# Patient Record
Sex: Male | Born: 1980 | Race: White | Hispanic: No | Marital: Single | Smoking: Current every day smoker
Health system: Southern US, Community
[De-identification: ages and names within clinical notes are randomized; demographics above are authoritative.]

## PROBLEM LIST (undated history)

## (undated) DIAGNOSIS — K219 Gastro-esophageal reflux disease without esophagitis: Secondary | ICD-10-CM

---

## 2002-11-20 ENCOUNTER — Emergency Department (HOSPITAL_COMMUNITY): Admission: EM | Admit: 2002-11-20 | Discharge: 2002-11-20 | Payer: Self-pay | Admitting: Emergency Medicine

## 2010-01-18 ENCOUNTER — Emergency Department (HOSPITAL_BASED_OUTPATIENT_CLINIC_OR_DEPARTMENT_OTHER): Admission: EM | Admit: 2010-01-18 | Discharge: 2010-01-18 | Payer: Self-pay | Admitting: Emergency Medicine

## 2010-01-18 ENCOUNTER — Ambulatory Visit: Payer: Self-pay | Admitting: Radiology

## 2010-07-19 ENCOUNTER — Emergency Department (HOSPITAL_BASED_OUTPATIENT_CLINIC_OR_DEPARTMENT_OTHER)
Admission: EM | Admit: 2010-07-19 | Discharge: 2010-07-19 | Payer: Self-pay | Source: Home / Self Care | Admitting: Emergency Medicine

## 2010-09-09 LAB — BASIC METABOLIC PANEL
BUN: 18 mg/dL (ref 6–23)
CO2: 27 mEq/L (ref 19–32)
Creatinine, Ser: 1 mg/dL (ref 0.4–1.5)
GFR calc non Af Amer: >60 mL/min (ref >60)
Potassium: 4 mEq/L (ref 3.5–5.1)
Sodium: 143 mEq/L (ref 135–145)

## 2010-09-09 LAB — BASIC METABOLIC PANEL WITH GFR
Calcium: 8.7 mg/dL (ref 8.4–10.5)
Chloride: 106 meq/L (ref 96–112)
GFR calc Af Amer: >60 mL/min (ref >60)
Glucose, Bld: 109 mg/dL — ABNORMAL HIGH (ref 70–99)

## 2010-09-09 LAB — POCT CARDIAC MARKERS
CKMB, poc: <1 ng/mL — ABNORMAL LOW (ref 1.0–8.0)
Myoglobin, poc: 43.2 ng/mL (ref 12–200)
Myoglobin, poc: 50.7 ng/mL (ref 12–200)

## 2010-09-09 LAB — D-DIMER, QUANTITATIVE: D-Dimer, Quant: <0.22 ug/mL-FEU (ref 0.00–0.48)

## 2010-10-07 ENCOUNTER — Emergency Department (HOSPITAL_BASED_OUTPATIENT_CLINIC_OR_DEPARTMENT_OTHER)
Admission: EM | Admit: 2010-10-07 | Discharge: 2010-10-07 | Disposition: A | Discharge status: Home / Self Care | Payer: Self-pay | Attending: Emergency Medicine | Admitting: Emergency Medicine

## 2010-10-07 ENCOUNTER — Emergency Department (INDEPENDENT_AMBULATORY_CARE_PROVIDER_SITE_OTHER): Payer: Self-pay

## 2010-10-07 DIAGNOSIS — S060X0A Concussion without loss of consciousness, initial encounter: Secondary | ICD-10-CM | POA: Insufficient documentation

## 2010-10-07 DIAGNOSIS — S1093XA Contusion of unspecified part of neck, initial encounter: Secondary | ICD-10-CM

## 2010-10-07 DIAGNOSIS — IMO0002 Reserved for concepts with insufficient information to code with codable children: Secondary | ICD-10-CM

## 2010-10-07 DIAGNOSIS — F17200 Nicotine dependence, unspecified, uncomplicated: Secondary | ICD-10-CM | POA: Insufficient documentation

## 2010-10-07 DIAGNOSIS — Y92009 Unspecified place in unspecified non-institutional (private) residence as the place of occurrence of the external cause: Secondary | ICD-10-CM | POA: Insufficient documentation

## 2011-12-18 ENCOUNTER — Emergency Department (HOSPITAL_BASED_OUTPATIENT_CLINIC_OR_DEPARTMENT_OTHER): Payer: Self-pay

## 2011-12-18 ENCOUNTER — Emergency Department (HOSPITAL_BASED_OUTPATIENT_CLINIC_OR_DEPARTMENT_OTHER)
Admission: EM | Admit: 2011-12-18 | Discharge: 2011-12-18 | Disposition: A | Discharge status: Home / Self Care | Payer: Self-pay | Attending: Emergency Medicine | Admitting: Emergency Medicine

## 2011-12-18 ENCOUNTER — Encounter (HOSPITAL_BASED_OUTPATIENT_CLINIC_OR_DEPARTMENT_OTHER): Payer: Self-pay | Admitting: *Deleted

## 2011-12-18 DIAGNOSIS — L0390 Cellulitis, unspecified: Secondary | ICD-10-CM

## 2011-12-18 DIAGNOSIS — K219 Gastro-esophageal reflux disease without esophagitis: Secondary | ICD-10-CM | POA: Insufficient documentation

## 2011-12-18 DIAGNOSIS — L02419 Cutaneous abscess of limb, unspecified: Secondary | ICD-10-CM | POA: Insufficient documentation

## 2011-12-18 HISTORY — DX: Gastro-esophageal reflux disease without esophagitis: K21.9

## 2011-12-18 MED ORDER — OXYCODONE-ACETAMINOPHEN 5-325 MG PO TABS
1.0000 | ORAL_TABLET | Freq: Once | ORAL | Status: AC
  Administered 2011-12-18: 1 via ORAL
  Filled 2011-12-18: qty 1

## 2011-12-18 MED ORDER — OXYCODONE-ACETAMINOPHEN 5-325 MG PO TABS: 1.0000 | ORAL_TABLET | Freq: Four times a day (QID) | ORAL | Status: AC | PRN

## 2011-12-18 MED ORDER — CLINDAMYCIN HCL 150 MG PO CAPS
300.0000 mg | ORAL_CAPSULE | Freq: Once | ORAL | Status: AC
  Administered 2011-12-18: 300 mg via ORAL
  Filled 2011-12-18 (×2): qty 1

## 2011-12-18 MED ORDER — CLINDAMYCIN HCL 150 MG PO CAPS: 150.0000 mg | ORAL_CAPSULE | Freq: Four times a day (QID) | ORAL | Status: AC

## 2011-12-18 NOTE — ED Notes (Addendum)
Site marked by family today. No increase in size to right ankle.

## 2011-12-18 NOTE — Discharge Instructions (Signed)
Cellulitis Cellulitis is an infection of the tissue under the skin. The infected area is usually red and tender. This is caused by germs. These germs enter the body through cuts or sores. This usually happens in the arms or lower legs. HOME CARE   Take your medicine as told. Finish it even if you start to feel better.   If the infection is on the arm or leg, keep it raised (elevated).   Use a warm cloth on the infected area several times a day.   See your doctor for a follow-up visit as told.  GET HELP RIGHT AWAY IF:   You are tired or confused.   You throw up (vomit).   You have watery poop (diarrhea).   You feel ill and have muscle aches.   You have a fever.  MAKE SURE YOU:   Understand these instructions.   Will watch your condition.   Will get help right away if you are not doing well or get worse.  Document Released: 11/28/2007 Document Revised: 05/31/2011 Document Reviewed: 05/13/2009 Exit[employer] Patient Information 2012 [employer]. 

## 2011-12-18 NOTE — ED Notes (Signed)
MD at bedside. 

## 2011-12-18 NOTE — ED Notes (Signed)
Patient transported to X-ray 
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
## 2011-12-18 NOTE — ED Notes (Signed)
Returned from xray

## 2011-12-18 NOTE — ED Provider Notes (Addendum)
History     CSN: [id number]  Arrival date & time 12/18/11  1925   First MD Initiated Contact with Patient 12/18/11 2038      Chief Complaint  Patient presents with  . Animal Bite    Stung by sting ray on June 14th at [facility] [employer].  Pt. has noted [place] and today is the 1st visit to any Dr. after he has been stung by sting ray.  Pt. has noted edema in the R outer ankle with drawing aroung the edema.    (Consider location/radiation/quality/duration/timing/severity/associated sxs/prior treatment) Patient is a 31 y.o. male presenting with animal bite. The history is provided by the patient.  Animal Bite  Episode onset: 11 days ago [place] which intially improved but then 2 days ago started getting red and swelled today. Incident location: in the ocean. He came to the ER via personal transport. There is an injury to the right foot. The pain is severe. It is unknown if a foreign body is present. Pertinent negatives include no nausea, no vomiting, no focal weakness, no tingling and no weakness. There have been prior injuries to these areas. There were no sick contacts. He has received no recent medical care.    Past Medical History  Diagnosis Date  . GERD (gastroesophageal reflux disease)     No past surgical history on file.  No family history on file.  History  Substance Use Topics  . Smoking status: Not on file  . Smokeless tobacco: Not on file  . Alcohol Use:       Review of Systems  Constitutional: Negative for fever.  Gastrointestinal: Negative for nausea and vomiting.  Skin: Negative for rash and wound.  Neurological: Negative for tingling, focal weakness and weakness.  All other systems reviewed and are negative.    Allergies  Review of patient's allergies indicates no known allergies.  Home Medications   Current Outpatient Rx  Name Route Sig Dispense Refill  . ACETAMINOPHEN 500 MG PO TABS  Oral Take 1,000 mg by mouth every 6 (six) hours as needed. Patient used this medication for his foot pain.    . ASPIRIN-ACETAMINOPHEN-CAFFEINE 250-250-65 MG PO TABS Oral Take 2 tablets by mouth every 6 (six) hours as needed. Patient used this medication for his migraine headache.    . ESOMEPRAZOLE MAGNESIUM 10 MG PO PACK Oral Take 10 mg by mouth daily before breakfast.      BP 123/78  Pulse 72  Temp 98.5 F (36.9 C) (Oral)  Resp 18  Ht 6\' 3"  (1.905 m)  Wt 201 lb (91.173 kg)  BMI 25.12 kg/m2  SpO2 98%  Physical Exam  Nursing note and vitals reviewed. Constitutional: He is oriented to person, place, and time. He appears well-developed and well-nourished. No distress.  HENT:  Head: Normocephalic and atraumatic.  Mouth/Throat: Oropharynx is clear and moist.  Eyes: Conjunctivae and EOM are normal. Pupils are equal, round, and reactive to light.  Neck: Normal range of motion. Neck supple.  Cardiovascular: Normal rate, regular rhythm and intact distal pulses.   No murmur heard. Pulmonary/Chest: Effort normal and breath sounds normal. No respiratory distress. He has no wheezes. He has no rales.  Musculoskeletal: Normal range of motion. He exhibits no edema and no tenderness.  Neurological: He is alert and oriented to person, place, and time.  Skin: Skin is warm and dry. No rash noted. There is erythema.  Erythema, warmth, swelling and small puncture wound without fluctuance or induration or drainage.  Psychiatric: He has a normal mood and affect. His behavior is normal.    ED Course  Procedures (including critical care time)  Labs Reviewed - No data to display Dg Foot Complete Right  12/18/2011  *RADIOLOGY REPORT*  Clinical Data: Right lateral foot edema post sting ray bite  RIGHT FOOT COMPLETE - 3+ VIEW  Comparison: None.  Findings: No fracture or dislocation.  Joint spaces are preserved. Regional soft tissues are normal.  No radiopaque foreign body.  No subcutaneous  emphysema.  IMPRESSION: No fracture or radiopaque foreign body.  Original Report Authenticated By: Waynard Reeds, M.D.     1. Cellulitis       MDM   Pt with evidence of cellulitis over the area where he was bit by a sting ray 11 days ago.  Infectious sx just started 2 days ago.  No systemic sx and no retained foreign body on x-ray.  Cellulitis marked and pt started on abx and pain control.        Gwyneth Sprout, MD 12/18/11 1610  Gwyneth Sprout, MD 12/18/11 2152

## 2013-06-16 ENCOUNTER — Emergency Department: Payer: Self-pay | Admitting: Emergency Medicine

## 2013-11-26 ENCOUNTER — Emergency Department: Payer: Self-pay | Admitting: Emergency Medicine

## 2014-09-21 ENCOUNTER — Emergency Department: Payer: Self-pay | Admitting: Emergency Medicine

## 2016-04-15 ENCOUNTER — Emergency Department
Admission: EM | Admit: 2016-04-15 | Discharge: 2016-04-15 | Disposition: A | Discharge status: Home / Self Care | Payer: Self-pay | Attending: Student in an Organized Health Care Education/Training Program | Admitting: Student in an Organized Health Care Education/Training Program

## 2016-04-15 ENCOUNTER — Encounter: Payer: Self-pay | Admitting: Emergency Medicine

## 2016-04-15 DIAGNOSIS — L739 Follicular disorder, unspecified: Secondary | ICD-10-CM | POA: Insufficient documentation

## 2016-04-15 DIAGNOSIS — Z79899 Other long term (current) drug therapy: Secondary | ICD-10-CM | POA: Insufficient documentation

## 2016-04-15 DIAGNOSIS — R519 Headache, unspecified: Secondary | ICD-10-CM

## 2016-04-15 DIAGNOSIS — R51 Headache: Secondary | ICD-10-CM

## 2016-04-15 DIAGNOSIS — F17210 Nicotine dependence, cigarettes, uncomplicated: Secondary | ICD-10-CM | POA: Insufficient documentation

## 2016-04-15 MED ORDER — TRAMADOL HCL 50 MG PO TABS: 50.0000 mg | ORAL_TABLET | Freq: Four times a day (QID) | ORAL | 0 refills | Status: DC | PRN

## 2016-04-15 MED ORDER — SULFAMETHOXAZOLE-TRIMETHOPRIM 800-160 MG PO TABS
1.0000 | ORAL_TABLET | Freq: Once | ORAL | Status: AC
  Administered 2016-04-15: 1 via ORAL
  Filled 2016-04-15: qty 1

## 2016-04-15 MED ORDER — SULFAMETHOXAZOLE-TRIMETHOPRIM 800-160 MG PO TABS: 1.0000 | ORAL_TABLET | Freq: Two times a day (BID) | ORAL | 0 refills | Status: DC

## 2016-04-15 NOTE — ED Triage Notes (Signed)
Pt c/o HA x 3 days and that he has been "sleeping a lot". Facial symmetry intact, grip strengths equal. Pt also c/o "knots on his head", the first on the back of his, and then another following the hairline around his ear. Pt is alert and oriented. NAD noted at this time.

## 2016-04-15 NOTE — ED Provider Notes (Signed)
[facility] Emergency Department Provider Note  ____________________________________________  Time seen: Approximately 7:08 PM  I have reviewed the triage vital signs and the nursing notes.   HISTORY  Chief Complaint Headache   HPI Taylor Owen is a 35 y.o. male who presents to the emergency department for evaluation of tender lesions to the left scalp that are causing him to have a headache. Symptoms started 3 days ago with small bumps in the hairline. The initial bumps have nearly resolved, but a large one has formed on the scalp near his ear and is very tender. He has taken [employer] which initially helped, but is no longer providing any relief. He denies fever or history of MRSA/skin infections.  Past Medical History:  Diagnosis Date  . GERD (gastroesophageal reflux disease)     There are no active problems to display for this patient.   History reviewed. No pertinent surgical history.  Prior to Admission medications   Medication Sig Start Date End Date Taking? Authorizing Provider  acetaminophen (TYLENOL) 500 MG tablet Take 1,000 mg by mouth every 6 (six) hours as needed. Patient used this medication for his foot pain.    Historical Provider, MD  aspirin-acetaminophen-caffeine (EXCEDRIN MIGRAINE) [phone] MG per tablet Take 2 tablets by mouth every 6 (six) hours as needed. Patient used this medication for his migraine headache.    Historical Provider, MD  esomeprazole (NEXIUM) 10 MG packet Take 10 mg by mouth daily before breakfast.    Historical Provider, MD  sulfamethoxazole-trimethoprim (BACTRIM DS,SEPTRA DS) 800-160 MG tablet Take 1 tablet by mouth 2 (two) times daily. 04/15/16   Chinita Pester, FNP  traMADol (ULTRAM) 50 MG tablet Take 1 tablet (50 mg total) by mouth every 6 (six) hours as needed. 04/15/16   Chinita Pester, FNP    Allergies Review of patient's allergies indicates no known allergies.  No family history on  file.  Social History Social History  Substance Use Topics  . Smoking status: Current Every Day Smoker    Packs/day: 1.50    Types: Cigarettes  . Smokeless tobacco: Never Used  . Alcohol use Yes     Comment: Occ    Review of Systems  Constitutional: Negative for fever/chills Respiratory: Negative for shortness of breath. Musculoskeletal: Negative for pain. Skin: Positive for tender lesions. Neurological: Negative for headaches, focal weakness or numbness. ____________________________________________   PHYSICAL EXAM:  VITAL SIGNS: ED Triage Vitals [04/15/16 1856]  Enc Vitals Group     BP 136/87     Pulse Rate 69     Resp 18     Temp 97.6 F (36.4 C)     Temp Source Oral     SpO2 97 %     Weight 185 lb (83.9 kg)     Height 6\' 3"  (1.905 m)     Head Circumference      Peak Flow      Pain Score 8     Pain Loc      Pain Edu?      Excl. in GC?      Constitutional: Alert and oriented. Well appearing and in no acute distress. Mouth/Throat: Mucous membranes are moist.   Neck: No stridor. Lymphatic: No cervical lymphadenopathy. Respiratory: Normal respiratory effort.  No retractions. Musculoskeletal: FROM throughout. Neurologic:  Normal speech and language. No gross focal neurologic deficits are appreciated. Skin:  Tender, erythematous, firm lesion to the left parietal area of the scalp just posterior to the left ear that  measures about 2cm in length and 1cm in width. No fluctuance or pointing at this time.    ____________________________________________   LABS (all labs ordered are listed, but only abnormal results are displayed)  Labs Reviewed - No data to display ____________________________________________  EKG   ____________________________________________  RADIOLOGY   ____________________________________________   PROCEDURES  Procedure(s) performed: No I&D indicated today. ____________________________________________   INITIAL IMPRESSION /  ASSESSMENT AND PLAN / ED COURSE  Clinical Course    Pertinent labs & imaging results that were available during my care of the patient were reviewed by me and considered in my medical decision making (see chart for details).  He will be advised to take the Bactrim and tramadol as prescribed.  He does not have a PCP and will return here if worse/not improving over the next 2-3 days. ____________________________________________   FINAL CLINICAL IMPRESSION(S) / ED DIAGNOSES  Final diagnoses:  Acute nonintractable headache, unspecified headache type  Folliculitis    Discharge Medication List as of 04/15/2016  7:25 PM    START taking these medications   Details  sulfamethoxazole-trimethoprim (BACTRIM DS,SEPTRA DS) 800-160 MG tablet Take 1 tablet by mouth 2 (two) times daily., Starting Sun 04/15/2016, Print    traMADol (ULTRAM) 50 MG tablet Take 1 tablet (50 mg total) by mouth every 6 (six) hours as needed., Starting Sun 04/15/2016, Print        Note:  This document was prepared using Dragon voice recognition software and may include unintentional dictation errors.    Chinita PesterCari B Tomoki Lucken, FNP 04/15/16 1949    Willy EddyPatrick Robinson, MD 04/16/16 [phone]

## 2016-04-15 NOTE — ED Notes (Signed)
Pt had a lump behind left ear and 2 smaller lumps in the hairline below that. No injury.

## 2018-07-08 ENCOUNTER — Emergency Department: Payer: Self-pay

## 2018-07-08 ENCOUNTER — Other Ambulatory Visit: Payer: Self-pay

## 2018-07-08 ENCOUNTER — Emergency Department
Admission: EM | Admit: 2018-07-08 | Discharge: 2018-07-08 | Disposition: A | Discharge status: Home / Self Care | Payer: Self-pay | Attending: Emergency Medicine | Admitting: Emergency Medicine

## 2018-07-08 ENCOUNTER — Encounter: Payer: Self-pay | Admitting: *Deleted

## 2018-07-08 DIAGNOSIS — Z79899 Other long term (current) drug therapy: Secondary | ICD-10-CM | POA: Insufficient documentation

## 2018-07-08 DIAGNOSIS — M25561 Pain in right knee: Secondary | ICD-10-CM | POA: Insufficient documentation

## 2018-07-08 DIAGNOSIS — F17210 Nicotine dependence, cigarettes, uncomplicated: Secondary | ICD-10-CM | POA: Insufficient documentation

## 2018-07-08 MED ORDER — TRAMADOL HCL 50 MG PO TABS: 50.0000 mg | ORAL_TABLET | Freq: Four times a day (QID) | ORAL | 0 refills | Status: AC | PRN

## 2018-07-08 NOTE — ED Provider Notes (Signed)
[facility] Emergency Department Provider Note  ____________________________________________  Time seen: Approximately 9:01 PM  I have reviewed the triage vital signs and the nursing notes.   HISTORY  Chief Complaint Knee Pain    HPI Kirby CriglerKenneth Tapp is a 38 y.o. male presents to the emergency department with acute 8/10 aching right knee pain.  Patient reports that he felt a pop and knee became difficult to extend.  Denies numbness or tingling.  No recent falls or traumas.  Patient denies prior instances of right knee pain. No alleviating measures have been attempted.    Past Medical History:  Diagnosis Date  . GERD (gastroesophageal reflux disease)     There are no active problems to display for this patient.   No past surgical history on file.  Prior to Admission medications   Medication Sig Start Date End Date Taking? Authorizing Provider  acetaminophen (TYLENOL) 500 MG tablet Take 1,000 mg by mouth every 6 (six) hours as needed. Patient used this medication for his foot pain.    [provider]  aspirin-acetaminophen-caffeine (EXCEDRIN MIGRAINE) [phone] MG per tablet Take 2 tablets by mouth every 6 (six) hours as needed. Patient used this medication for his migraine headache.    [provider]  esomeprazole (NEXIUM) 10 MG packet Take 10 mg by mouth daily before breakfast.    [provider]  sulfamethoxazole-trimethoprim (BACTRIM DS,SEPTRA DS) 800-160 MG tablet Take 1 tablet by mouth 2 (two) times daily. 04/15/16   Triplett, Rulon Eisenmengerari B, FNP  traMADol (ULTRAM) 50 MG tablet Take 1 tablet (50 mg total) by mouth every 6 (six) hours as needed for up to 3 days. 07/08/18 07/11/18  Orvil FeilWoods, Eulalie Speights M, PA-C    Allergies Patient has no known allergies.  No family history on file.  Social History Social History   Tobacco Use  . Smoking status: Current Every Day Smoker    Packs/day: 1.50    Types: Cigarettes  . Smokeless tobacco:  Never Used  Substance Use Topics  . Alcohol use: Not Currently    Comment: Occ  . Drug use: Yes    Frequency: 2.0 times per week    Types: Marijuana     Review of Systems  Constitutional: No fever/chills Eyes: No visual changes. No discharge ENT: No upper respiratory complaints. Cardiovascular: no chest pain. Respiratory: no cough. No SOB. Gastrointestinal: No abdominal pain.  No nausea, no vomiting.  No diarrhea.  No constipation. Musculoskeletal: Patient has right knee pain.  Skin: Negative for rash, abrasions, lacerations, ecchymosis. Neurological: Negative for headaches, focal weakness or numbness.   ____________________________________________   PHYSICAL EXAM:  VITAL SIGNS: ED Triage Vitals [07/08/18 1656]  Enc Vitals Group     BP 135/77     Pulse Rate 100     Resp 18     Temp 97.7 F (36.5 C)     Temp Source Oral     SpO2 97 %     Weight 184 lb (83.5 kg)     Height 6\' 3"  (1.905 m)     Head Circumference      Peak Flow      Pain Score 9     Pain Loc      Pain Edu?      Excl. in GC?      Constitutional: Alert and oriented. Well appearing and in no acute distress. Eyes: Conjunctivae are normal. PERRL. EOMI. Head: Atraumatic.  Cardiovascular: Normal rate, regular rhythm. Normal S1 and S2.  Good peripheral  circulation. Respiratory: Normal respiratory effort without tachypnea or retractions. Lungs CTAB. Good air entry to the bases with no decreased or absent breath sounds. Gastrointestinal: Bowel sounds 4 quadrants. Soft and nontender to palpation. No guarding or rigidity. No palpable masses. No distention. No CVA tenderness. Musculoskeletal: Patient is unable to perform full range of motion at the right knee, likely secondary to pain.  Tenderness is elicited with palpation over the lateral femoral condyle.  Mild popliteal swelling appreciated to palpation on the right.  No other deficits with provocative testing.  Palpable dorsalis pedis pulse bilaterally and  symmetrically. Neurologic:  Normal speech and language. No gross focal neurologic deficits are appreciated.  Skin:  Skin is warm, dry and intact. No rash noted. Psychiatric: Mood and affect are normal. Speech and behavior are normal. Patient exhibits appropriate insight and judgement.   ____________________________________________   LABS (all labs ordered are listed, but only abnormal results are displayed)  Labs Reviewed - No data to display ____________________________________________  EKG   ____________________________________________  RADIOLOGY I personally viewed and evaluated these images as part of my medical decision making, as well as reviewing the written report by the radiologist.  Dg Knee Complete 4 Views Right  Result Date: 07/08/2018 CLINICAL DATA:  Onset right knee pain today when the patient heard a pop. Limited range of motion. Initial encounter. EXAM: RIGHT KNEE - COMPLETE 4+ VIEW COMPARISON:  Plain films right knee 06/16/2013. FINDINGS: No evidence of fracture, dislocation, or joint effusion. No evidence of arthropathy or other focal bone abnormality. Soft tissues are unremarkable. IMPRESSION: Negative exam. Electronically Signed   By: Drusilla Kanner M.D.   On: 07/08/2018 17:16    ____________________________________________    PROCEDURES  Procedure(s) performed:    Procedures    Medications - No data to display   ____________________________________________   INITIAL IMPRESSION / ASSESSMENT AND PLAN / ED COURSE  Pertinent labs & imaging results that were available during my care of the patient were reviewed by me and considered in my medical decision making (see chart for details).  Review of the [place] CSRS was performed in accordance of the [facility] prior to dispensing any controlled drugs.    Assessment and Plan: Right knee pain Patient presents to the emergency department with acute right knee pain that occurred today after patient reports that  he heard a pop while squatting.  Differential diagnosis included meniscal tear versus ligament strain.  Patient had tenderness with palpation over the lateral femoral condyle and had difficulty performing extension at the knee, increasing suspicion for meniscal tear.  Patient was given a knee immobilizer and was given a short course of tramadol for pain as patient cannot tolerate anti-inflammatories given history of gastric ulcers.  Patient was referred to orthopedics.  All patient questions were answered.   ____________________________________________  FINAL CLINICAL IMPRESSION(S) / ED DIAGNOSES  Final diagnoses:  Acute pain of right knee      NEW MEDICATIONS STARTED DURING THIS VISIT:  ED Discharge Orders         Ordered    traMADol (ULTRAM) 50 MG tablet  Every 6 hours PRN     07/08/18 1857              This chart was dictated using voice recognition software/Dragon. Despite best efforts to proofread, errors can occur which can change the meaning. Any change was purely unintentional.    Gasper Lloyd 07/08/18 2109    Myrna Blazer, MD 07/08/18 2253

## 2018-07-08 NOTE — ED Triage Notes (Signed)
Pt has right knee pain.  Pt states he heard a pop and fell backwards.   No swelling.  Pt alert.

## 2021-08-26 ENCOUNTER — Emergency Department (HOSPITAL_COMMUNITY)
Admission: EM | Admit: 2021-08-26 | Discharge: 2021-08-26 | Disposition: A | Discharge status: Home / Self Care | Payer: 59 | Attending: Emergency Medicine | Admitting: Emergency Medicine

## 2021-08-26 ENCOUNTER — Emergency Department (HOSPITAL_COMMUNITY): Payer: 59

## 2021-08-26 ENCOUNTER — Encounter (HOSPITAL_COMMUNITY): Payer: Self-pay | Admitting: Emergency Medicine

## 2021-08-26 ENCOUNTER — Other Ambulatory Visit: Payer: Self-pay

## 2021-08-26 DIAGNOSIS — S42432A Displaced fracture (avulsion) of lateral epicondyle of left humerus, initial encounter for closed fracture: Secondary | ICD-10-CM

## 2021-08-26 DIAGNOSIS — Z7982 Long term (current) use of aspirin: Secondary | ICD-10-CM | POA: Insufficient documentation

## 2021-08-26 DIAGNOSIS — Y92410 Unspecified street and highway as the place of occurrence of the external cause: Secondary | ICD-10-CM | POA: Insufficient documentation

## 2021-08-26 DIAGNOSIS — S0990XA Unspecified injury of head, initial encounter: Secondary | ICD-10-CM | POA: Diagnosis not present

## 2021-08-26 DIAGNOSIS — S42452A Displaced fracture of lateral condyle of left humerus, initial encounter for closed fracture: Secondary | ICD-10-CM | POA: Diagnosis not present

## 2021-08-26 DIAGNOSIS — S6992XA Unspecified injury of left wrist, hand and finger(s), initial encounter: Secondary | ICD-10-CM | POA: Diagnosis present

## 2021-08-26 DIAGNOSIS — Z23 Encounter for immunization: Secondary | ICD-10-CM | POA: Insufficient documentation

## 2021-08-26 MED ORDER — OXYCODONE HCL 5 MG PO TABS: 5.0000 mg | ORAL_TABLET | ORAL | 0 refills | Status: AC | PRN

## 2021-08-26 MED ORDER — MORPHINE SULFATE (PF) 4 MG/ML IV SOLN
4.0000 mg | Freq: Once | INTRAVENOUS | Status: DC
  Filled 2021-08-26: qty 1

## 2021-08-26 MED ORDER — NAPROXEN 500 MG PO TABS: 500.0000 mg | ORAL_TABLET | Freq: Two times a day (BID) | ORAL | 0 refills | Status: DC

## 2021-08-26 MED ORDER — TETANUS-DIPHTH-ACELL PERTUSSIS 5-2.5-18.5 LF-MCG/0.5 IM SUSY
0.5000 mL | PREFILLED_SYRINGE | Freq: Once | INTRAMUSCULAR | Status: AC
  Administered 2021-08-26: 0.5 mL via INTRAMUSCULAR
  Filled 2021-08-26: qty 0.5

## 2021-08-26 MED ORDER — LIDOCAINE 5 % EX PTCH
1.0000 | MEDICATED_PATCH | CUTANEOUS | 0 refills | Status: AC
Start: 2021-08-26 — End: ?
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
## 2021-08-26 NOTE — Progress Notes (Signed)
Orthopedic Tech Progress Note ?Patient Details:  ?Taylor Owen ?June 22, 1981 ?[id number] ? ?Pt refused application of sling at this time stating, "it just stopped hurting so I'm not moving it again". Pt is in agreement to wear sling when he gets changed into his own clothes upon d/c. Sling is at bedside with the pt's belongings. ? ?Ortho Devices ?Type of Ortho Device: Sling immobilizer ?Ortho Device/Splint Location: for LUE, at bedside with pt belongings. ?Ortho Device/Splint Interventions: Ordered ?  ?Post Interventions ?Patient Tolerated: Refused intervention ?Instructions Provided: Adjustment of device, Care of device, Poper ambulation with device ? ?Sopheap Boehle Carmine Savoy ?08/26/2021, 5:42 PM ? ?

## 2021-08-26 NOTE — Discharge Instructions (Addendum)
Your today showed a small chip off the elbow.  We have placed you in a sling.  Make sure to ice and keep elevated. ? ?You need to follow-up with orthopedist.  I have placed the contact information for 1 in the discharge directions.  You will need to call to schedule appointment ?

## 2022-02-20 ENCOUNTER — Emergency Department (HOSPITAL_COMMUNITY)
Admission: EM | Admit: 2022-02-20 | Discharge: 2022-02-20 | Disposition: A | Discharge status: Home / Self Care | Payer: No Typology Code available for payment source | Attending: Emergency Medicine | Admitting: Emergency Medicine

## 2022-02-20 ENCOUNTER — Emergency Department (HOSPITAL_COMMUNITY): Payer: No Typology Code available for payment source

## 2022-02-20 ENCOUNTER — Encounter (HOSPITAL_COMMUNITY): Payer: Self-pay

## 2022-02-20 ENCOUNTER — Other Ambulatory Visit: Payer: Self-pay

## 2022-02-20 DIAGNOSIS — S8012XA Contusion of left lower leg, initial encounter: Secondary | ICD-10-CM | POA: Diagnosis not present

## 2022-02-20 DIAGNOSIS — W208XXA Other cause of strike by thrown, projected or falling object, initial encounter: Secondary | ICD-10-CM | POA: Diagnosis not present

## 2022-02-20 DIAGNOSIS — Y93H2 Activity, gardening and landscaping: Secondary | ICD-10-CM | POA: Insufficient documentation

## 2022-02-20 DIAGNOSIS — S8992XA Unspecified injury of left lower leg, initial encounter: Secondary | ICD-10-CM | POA: Diagnosis present

## 2022-02-20 MED ORDER — IBUPROFEN 200 MG PO TABS
400.0000 mg | ORAL_TABLET | Freq: Once | ORAL | Status: AC
  Administered 2022-02-20: 400 mg via ORAL
  Filled 2022-02-20: qty 2

## 2022-02-20 MED ORDER — HYDROCODONE-ACETAMINOPHEN 5-325 MG PO TABS
1.0000 | ORAL_TABLET | Freq: Once | ORAL | Status: AC
  Administered 2022-02-20: 1 via ORAL
  Filled 2022-02-20: qty 1

## 2022-02-20 NOTE — Discharge Instructions (Signed)
Wear the ace wrap over the next few days to help with swelling and pain.  Take ibuprofen (2 tabs) and 2 extra strength tylenol together every 6 hours for pain.  Ice and elevate over the next few days.
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
## 2022-02-20 NOTE — ED Notes (Signed)
No active bleeding to leg. CSM WNL. No signs of distress. VSS
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
## 2022-03-12 ENCOUNTER — Ambulatory Visit
Admission: EM | Admit: 2022-03-12 | Discharge: 2022-03-12 | Disposition: A | Discharge status: Home / Self Care | Payer: No Typology Code available for payment source | Attending: Internal Medicine | Admitting: Internal Medicine

## 2022-03-12 DIAGNOSIS — L03116 Cellulitis of left lower limb: Secondary | ICD-10-CM | POA: Diagnosis not present

## 2022-03-12 MED ORDER — SULFAMETHOXAZOLE-TRIMETHOPRIM 800-160 MG PO TABS: 1.0000 | ORAL_TABLET | Freq: Two times a day (BID) | ORAL | 0 refills | Status: AC

## 2022-03-12 MED ORDER — CEPHALEXIN 500 MG PO CAPS: 500.0000 mg | ORAL_CAPSULE | Freq: Four times a day (QID) | ORAL | 0 refills | Status: DC

## 2022-03-12 MED ORDER — SULFAMETHOXAZOLE-TRIMETHOPRIM 800-160 MG PO TABS: 1.0000 | ORAL_TABLET | Freq: Two times a day (BID) | ORAL | 0 refills | Status: DC

## 2022-03-12 NOTE — Discharge Instructions (Signed)
Your leg wound is infected.  I have prescribed you 2 antibiotics.  Please keep wound covered until healed over.  Change dressing daily and as needed if it becomes soiled.  Please follow-up in 2 to 3 days to have recheck if not able to see PCP.  PCP appointment was made for you prior discharge.
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
## 2022-03-12 NOTE — ED Provider Notes (Signed)
[facility]    CSN: [id number] Arrival date & time: 03/12/22  1403      History   Chief Complaint Chief Complaint  Patient presents with   wound care    HPI Taylor Owen is a 41 y.o. male.   Patient presents for further evaluation of wound to the left anterior leg.  Patient was seen in ED on 02/20/2022 after an incident occurred that caused a tree limb to impact his leg.  He had a negative x-ray of his leg at that time.  He was advised that he had a hematoma and to apply Ace wrap and ice after discharge.  Patient is concerned today given that he had increased swelling, redness, drainage to the leg that started yesterday.  Denies numbness or tingling.  Patient is able to bear weight.  Denies any fever, body aches, chills.     Past Medical History:  Diagnosis Date   GERD (gastroesophageal reflux disease)     There are no problems to display for this patient.   History reviewed. No pertinent surgical history.     Home Medications    Prior to Admission medications   Medication Sig Start Date End Date Taking? Authorizing Provider  acetaminophen (TYLENOL) 500 MG tablet Take 1,000 mg by mouth every 6 (six) hours as needed. Patient used this medication for his foot pain.    [provider]  aspirin-acetaminophen-caffeine (EXCEDRIN MIGRAINE) [phone] MG per tablet Take 2 tablets by mouth every 6 (six) hours as needed. Patient used this medication for his migraine headache.    [provider]  cephALEXin (KEFLEX) 500 MG capsule Take 1 capsule (500 mg total) by mouth 4 (four) times daily. 03/12/22   Teodora Medici, FNP  esomeprazole (NEXIUM) 10 MG packet Take 10 mg by mouth daily before breakfast.    [provider]  lidocaine (LIDODERM) 5 % Place 1 patch onto the skin daily. Remove & Discard patch within 12 hours or as directed by MD 08/26/21   Henderly, Britni A, PA-C  naproxen (NAPROSYN) 500 MG tablet Take 1 tablet (500 mg total) by mouth  2 (two) times daily. 08/26/21   Henderly, Britni A, PA-C  oxyCODONE (ROXICODONE) 5 MG immediate release tablet Take 1 tablet (5 mg total) by mouth every 4 (four) hours as needed for severe pain. 08/26/21   Henderly, Britni A, PA-C  sulfamethoxazole-trimethoprim (BACTRIM DS) 800-160 MG tablet Take 1 tablet by mouth 2 (two) times daily for 7 days. 03/12/22 03/19/22  Teodora Medici, FNP    Family History History reviewed. No pertinent family history.  Social History Social History   Tobacco Use   Smoking status: Every Day    Packs/day: 1.50    Types: Cigarettes   Smokeless tobacco: Never  Substance Use Topics   Alcohol use: Not Currently    Comment: Occ   Drug use: Yes    Frequency: 2.0 times per week    Types: Marijuana     Allergies   Patient has no known allergies.   Review of Systems Review of Systems Per HPI  Physical Exam Triage Vital Signs ED Triage Vitals  Enc Vitals Group     BP 03/12/22 1525 117/84     Pulse Rate 03/12/22 1525 (!) 120     Resp 03/12/22 1525 16     Temp 03/12/22 1525 98.3 F (36.8 C)     Temp Source 03/12/22 1525 Oral     SpO2 03/12/22 1525 96 %  Weight --      Height --      Head Circumference --      Peak Flow --      Pain Score 03/12/22 1526 3     Pain Loc --      Pain Edu? --      Excl. in [place]? --    No data found.  Updated Vital Signs BP 117/84 (BP Location: Left Arm)   Pulse (!) 120   Temp 98.3 F (36.8 C) (Oral)   Resp 16   SpO2 96%   Visual Acuity Right Eye Distance:   Left Eye Distance:   Bilateral Distance:    Right Eye Near:   Left Eye Near:    Bilateral Near:     Physical Exam Constitutional:      General: He is not in acute distress.    Appearance: Normal appearance. He is not toxic-appearing or diaphoretic.  HENT:     Head: Normocephalic and atraumatic.  Eyes:     Extraocular Movements: Extraocular movements intact.     Conjunctiva/sclera: Conjunctivae normal.  Pulmonary:     Effort: Pulmonary effort is  normal.  Skin:    Comments: Patient has approximately 3 inch x 1 inch abrasion area present to left anterior mid shin.  He has surrounding erythema and swelling.  There appears to be some purulent and bloody drainage noted.  Has full range of motion of leg, ankle, toes.  Capillary refill and pulses normal.  Neurological:     General: No focal deficit present.     Mental Status: He is alert and oriented to person, place, and time. Mental status is at baseline.  Psychiatric:        Mood and Affect: Mood normal.        Behavior: Behavior normal.        Thought Content: Thought content normal.        Judgment: Judgment normal.      UC Treatments / Results  Labs (all labs ordered are listed, but only abnormal results are displayed) Labs Reviewed - No data to display  EKG   Radiology No results found.  Procedures Procedures (including critical care time)  Medications Ordered in UC Medications - No data to display  Initial Impression / Assessment and Plan / UC Course  I have reviewed the triage vital signs and the nursing notes.  Pertinent labs & imaging results that were available during my care of the patient were reviewed by me and considered in my medical decision making (see chart for details).     Patient has abrasion to left anterior shin after injury that occurred.  It appears to be infected.  Will treat with cephalexin and Bactrim.  No concern for systemic or more concerning infection but patient was advised that close follow-up is necessary.  PCP appointment was made for patient prior to discharge for 03/14/22.  Patient advised that if he is not able to make this appointment or if symptoms worsen that he needs close follow-up with urgent care or ER.  Nonadherent dressing applied prior to discharge and patient was advised to keep covered until healed over.  Also advised elevation of extremity.  Patient verbalized understanding and was agreeable with plan. Final Clinical  Impressions(s) / UC Diagnoses   Final diagnoses:  Cellulitis of leg, left     Discharge Instructions      Your leg wound is infected.  I have prescribed you 2 antibiotics.  Please keep wound  covered until healed over.  Change dressing daily and as needed if it becomes soiled.  Please follow-up in 2 to 3 days to have recheck if not able to see PCP.  PCP appointment was made for you prior discharge.    ED Prescriptions     Medication Sig Dispense Auth. Provider   cephALEXin (KEFLEX) 500 MG capsule  (Status: Discontinued) Take 1 capsule (500 mg total) by mouth 4 (four) times daily. 28 capsule [place], [place] E, [place]   sulfamethoxazole-trimethoprim (BACTRIM DS) 800-160 MG tablet  (Status: Discontinued) Take 1 tablet by mouth 2 (two) times daily for 7 days. 14 tablet [place], [place] E, [place]   cephALEXin (KEFLEX) 500 MG capsule Take 1 capsule (500 mg total) by mouth 4 (four) times daily. 28 capsule [place], [place] E, [place]   sulfamethoxazole-trimethoprim (BACTRIM DS) 800-160 MG tablet Take 1 tablet by mouth 2 (two) times daily for 7 days. 14 tablet [place], Michele Rockers, [place]      PDMP not reviewed this encounter.   Teodora Medici, [place] 03/12/22 [phone]

## 2022-03-14 ENCOUNTER — Ambulatory Visit: Payer: Self-pay | Admitting: Family Medicine

## 2022-03-30 DIAGNOSIS — Z7982 Long term (current) use of aspirin: Secondary | ICD-10-CM | POA: Insufficient documentation

## 2022-03-30 DIAGNOSIS — M545 Low back pain, unspecified: Secondary | ICD-10-CM | POA: Diagnosis present

## 2022-03-31 ENCOUNTER — Other Ambulatory Visit: Payer: Self-pay

## 2022-03-31 ENCOUNTER — Emergency Department (HOSPITAL_COMMUNITY): Payer: No Typology Code available for payment source

## 2022-03-31 ENCOUNTER — Emergency Department (HOSPITAL_COMMUNITY)
Admission: EM | Admit: 2022-03-31 | Discharge: 2022-03-31 | Disposition: A | Discharge status: Home / Self Care | Payer: No Typology Code available for payment source | Attending: Emergency Medicine | Admitting: Emergency Medicine

## 2022-03-31 ENCOUNTER — Encounter (HOSPITAL_COMMUNITY): Payer: Self-pay | Admitting: Emergency Medicine

## 2022-03-31 DIAGNOSIS — M545 Low back pain, unspecified: Secondary | ICD-10-CM

## 2022-03-31 MED ORDER — NAPROXEN 500 MG PO TABS: 500.0000 mg | ORAL_TABLET | Freq: Two times a day (BID) | ORAL | 0 refills | Status: AC

## 2022-03-31 MED ORDER — METHOCARBAMOL 500 MG PO TABS
1000.0000 mg | ORAL_TABLET | Freq: Once | ORAL | Status: AC
Start: 2022-03-31 — End: 2022-03-31
  Administered 2022-03-31: 1000 mg via ORAL
  Filled 2022-03-31: qty 2

## 2022-03-31 MED ORDER — KETOROLAC TROMETHAMINE 60 MG/2ML IM SOLN
30.0000 mg | Freq: Once | INTRAMUSCULAR | Status: AC
  Administered 2022-03-31: 30 mg via INTRAMUSCULAR
  Filled 2022-03-31: qty 2

## 2022-03-31 MED ORDER — CYCLOBENZAPRINE HCL 10 MG PO TABS
5.0000 mg | ORAL_TABLET | Freq: Every day | ORAL | 0 refills | Status: AC
Start: 2022-03-31 — End: 2022-04-10

## 2022-03-31 NOTE — ED Triage Notes (Signed)
Pt reported to ED with c/o lower back pain after roof fell on him 2 days ago. Pt states that he can walk when he needs to but pain is excruciating. Denies any LOC, head injury or injury to neck during event.

## 2022-03-31 NOTE — ED Notes (Signed)
Patient transported to X-ray 

## 2022-03-31 NOTE — ED Notes (Signed)
Pt states 2 days ago he was assisting his friend on a roof. Pt states that together they were lifting something up off the roof and he heard  a "pop" in his back and has had pain since. Pt denies any falling.

## 2022-03-31 NOTE — ED Provider Notes (Signed)
[facility] EMERGENCY DEPARTMENT Provider Note  CSN: [id number] Arrival date & time: 03/30/22 2354  Chief Complaint(s) No chief complaint on file.  HPI Taylor Owen is a 41 y.o. male with no pertinent past medical history who presents to the emergency department for acute lower back pain that began 2 days ago.  He reports trying to lift up a portion of the roof when he felt a pop in his lower back.  Since then he has been having constant lower back pain.  Pain was initially moderate in intensity.  Noted to be more severe the next morning upon awakening.  Alleviated only by immobility.  Pain is exacerbated with any type of movement.  He denies any bladder/bowel incontinence, lower extremity weakness or loss of sensation, radiculopathy to either lower extremity.  The history is provided by the patient.    Past Medical History Past Medical History:  Diagnosis Date   GERD (gastroesophageal reflux disease)    There are no problems to display for this patient.  Home Medication(s) Prior to Admission medications   Medication Sig Start Date End Date Taking? Authorizing Provider  cyclobenzaprine (FLEXERIL) 10 MG tablet Take 0.5-1 tablets (5-10 mg total) by mouth at bedtime for 10 days. 03/31/22 04/10/22 Yes Maryagnes Carrasco, Amadeo Garnet, MD  naproxen (NAPROSYN) 500 MG tablet Take 1 tablet (500 mg total) by mouth 2 (two) times daily for 10 days. 03/31/22 04/10/22 Yes Kacen Mellinger, Amadeo Garnet, MD  acetaminophen (TYLENOL) 500 MG tablet Take 1,000 mg by mouth every 6 (six) hours as needed. Patient used this medication for his foot pain.    [provider]  aspirin-acetaminophen-caffeine (EXCEDRIN MIGRAINE) [phone] MG per tablet Take 2 tablets by mouth every 6 (six) hours as needed. Patient used this medication for his migraine headache.    [provider]  cephALEXin (KEFLEX) 500 MG capsule Take 1 capsule (500 mg total) by mouth 4 (four) times daily. 03/12/22   Gustavus Bryant, FNP  esomeprazole (NEXIUM) 10 MG packet Take 10 mg by mouth daily before breakfast.    [provider]  lidocaine (LIDODERM) 5 % Place 1 patch onto the skin daily. Remove & Discard patch within 12 hours or as directed by MD 08/26/21   Henderly, Britni A, PA-C  oxyCODONE (ROXICODONE) 5 MG immediate release tablet Take 1 tablet (5 mg total) by mouth every 4 (four) hours as needed for severe pain. 08/26/21   Henderly, Britni A, PA-C                                                                                                                                    Allergies Patient has no known allergies.  Review of Systems Review of Systems As noted in HPI  Physical Exam Vital Signs  I have reviewed the triage vital signs BP (!) 130/91   Pulse 79   Temp 98.1 F (36.7 C)   Resp (!)  22   SpO2 98%   Physical Exam Vitals reviewed.  Constitutional:      General: He is not in acute distress.    Appearance: He is well-developed. He is not diaphoretic.  HENT:     Head: Normocephalic and atraumatic.     Right Ear: External ear normal.     Left Ear: External ear normal.     Nose: Nose normal.     Mouth/Throat:     Mouth: Mucous membranes are moist.  Eyes:     General: No scleral icterus.    Conjunctiva/sclera: Conjunctivae normal.  Neck:     Trachea: Phonation normal.  Cardiovascular:     Rate and Rhythm: Normal rate and regular rhythm.  Pulmonary:     Effort: Pulmonary effort is normal. No respiratory distress.     Breath sounds: No stridor.  Abdominal:     General: There is no distension.  Musculoskeletal:        General: Normal range of motion.     Cervical back: Normal range of motion.     Lumbar back: Tenderness present. No bony tenderness.       Back:  Neurological:     Mental Status: He is alert and oriented to person, place, and time.     Comments: Spine Exam: Strength: 5/5 throughout LE bilaterally (hip flexion/extension, adduction/abduction; knee  flexion/extension; foot dorsiflexion/plantarflexion, inversion/eversion; great toe inversion) Sensation: Intact to light touch in proximal and distal LE bilaterally Reflexes: no clonus   Psychiatric:        Behavior: Behavior normal.     ED Results and Treatments Labs (all labs ordered are listed, but only abnormal results are displayed) Labs Reviewed - No data to display                                                                                                                       EKG  EKG Interpretation  Date/Time:    Ventricular Rate:    PR Interval:    QRS Duration:   QT Interval:    QTC Calculation:   R Axis:     Text Interpretation:         Radiology DG Lumbar Spine Complete  Result Date: 03/31/2022 CLINICAL DATA:  Back injury 2 days ago.  Difficulty ambulating. EXAM: LUMBAR SPINE - COMPLETE 4+ VIEW COMPARISON:  AP Lat 11/26/2013 FINDINGS: There is no evidence of lumbar spine fracture. Alignment is normal. Intervertebral disc spaces are maintained. There is progressive marginal osteophytosis of the lumbar spine from L3 down, increased mild facet joint spurring at the lowest 2 levels. The SI joints are patent.  Visualized soft tissues are unremarkable. IMPRESSION: Progressive lower lumbar spondylosis and facet joint spurring. No evidence of fractures or listhesis. Electronically Signed   By: Almira Bar M.D.   On: 03/31/2022 02:45    Medications Ordered in ED Medications  ketorolac (TORADOL) injection 30 mg (has no administration in time range)  methocarbamol (ROBAXIN) tablet 1,000 mg (has no  administration in time range)                                                                                                                                     Procedures Procedures  (including critical care time)  Medical Decision Making / ED Course   Medical Decision Making Amount and/or Complexity of Data Reviewed Radiology: ordered.  Risk Prescription drug  management.    41 y.o. male presents with back pain in lumbar area for 2 days without signs of radicular pain. No acute traumatic onset. No red flag symptoms of fever, weight loss, saddle anesthesia, weakness, fecal/urinary incontinence or urinary retention.   X-ray obtained to rule out compression fracture which was negative.  No indication for more advanced imaging at this time.   Patient was recommended to take short course of scheduled NSAIDs and engage in early mobility as definitive treatment. Return precautions discussed for worsening or new concerning symptoms.        Final Clinical Impression(s) / ED Diagnoses Final diagnoses:  Acute midline low back pain without sciatica   The patient appears reasonably screened and/or stabilized for discharge and I doubt any other medical condition or other [facility] requiring further screening, evaluation, or treatment in the ED at this time. I have discussed the findings, Dx and Tx plan with the patient/family who expressed understanding and agree(s) with the plan. Discharge instructions discussed at length. The patient/family was given strict return precautions who verbalized understanding of the instructions. No further questions at time of discharge.  Disposition: Discharge  Condition: Good  ED Discharge Orders          Ordered    naproxen (NAPROSYN) 500 MG tablet  2 times daily        03/31/22 0326    cyclobenzaprine (FLEXERIL) 10 MG tablet  Daily at bedtime        03/31/22 0326              Follow Up: Primary care provider  Call  to schedule an appointment for close follow up           This chart was dictated using voice recognition software.  Despite best efforts to proofread,  errors can occur which can change the documentation meaning.    Fatima Blank, MD 03/31/22 [phone]

## 2022-04-04 ENCOUNTER — Ambulatory Visit (INDEPENDENT_AMBULATORY_CARE_PROVIDER_SITE_OTHER): Payer: No Typology Code available for payment source

## 2022-04-04 ENCOUNTER — Ambulatory Visit
Admission: RE | Admit: 2022-04-04 | Discharge: 2022-04-04 | Disposition: A | Discharge status: Home / Self Care | Payer: No Typology Code available for payment source | Source: Ambulatory Visit | Attending: Internal Medicine | Admitting: Internal Medicine

## 2022-04-04 VITALS — BP 124/88 | HR 86 | Temp 97.2°F | Resp 18

## 2022-04-04 DIAGNOSIS — M79605 Pain in left leg: Secondary | ICD-10-CM | POA: Diagnosis not present

## 2022-04-04 DIAGNOSIS — L03116 Cellulitis of left lower limb: Secondary | ICD-10-CM

## 2022-04-04 DIAGNOSIS — S8992XA Unspecified injury of left lower leg, initial encounter: Secondary | ICD-10-CM | POA: Diagnosis not present

## 2022-04-04 MED ORDER — DOXYCYCLINE HYCLATE 100 MG PO CAPS: 100.0000 mg | ORAL_CAPSULE | Freq: Two times a day (BID) | ORAL | 0 refills | Status: AC

## 2022-04-04 NOTE — ED Triage Notes (Signed)
Pt is present today with a wound on his left lower leg. Pt states that he noticed the pain x2 days ago. Pt denies any drainage from the site

## 2022-04-04 NOTE — Discharge Instructions (Signed)
X-ray was normal.  I have prescribed you additional antibiotics. please take medication with food.  Follow-up with primary care doctor and/or urgent care for further evaluation and management.  Monitor for worsening signs of infection that include increased redness, swelling, pus, fever and follow-up if they occur.

## 2022-04-04 NOTE — ED Provider Notes (Addendum)
[facility]    CSN: [id number] Arrival date & time: 04/04/22  1146      History   Chief Complaint Chief Complaint  Patient presents with   Leg Injury    Follow up from injury - Entered by patient    HPI Taylor Owen is a 41 y.o. male.   Patient presents with left lower leg pain.  Patient was seen 03/12/2022 after an injury that occurred to leg.  There was concern for infection so patient was started on cephalexin and Bactrim which he states he has taken completely. He reports improvement of symptoms with that medication.  An appointment with PCP was made for patient on 03/14/2022 but patient reports that he did not go to this given that it was in [place], [place] and this is too far for him.  Patient states that he had another limb hit his leg in a similar place a few days prior to this current urgent care visit.  He noticed redness, swelling, pain in same location about 2 days ago.  Denies any purulent drainage.  Denies any associated fever.     Past Medical History:  Diagnosis Date   GERD (gastroesophageal reflux disease)     There are no problems to display for this patient.   History reviewed. No pertinent surgical history.     Home Medications    Prior to Admission medications   Medication Sig Start Date End Date Taking? Authorizing Provider  doxycycline (VIBRAMYCIN) 100 MG capsule Take 1 capsule (100 mg total) by mouth 2 (two) times daily. 04/04/22  Yes Emina Ribaudo, Rolly Salter E, FNP  acetaminophen (TYLENOL) 500 MG tablet Take 1,000 mg by mouth every 6 (six) hours as needed. Patient used this medication for his foot pain.    [provider]  aspirin-acetaminophen-caffeine (EXCEDRIN MIGRAINE) [phone] MG per tablet Take 2 tablets by mouth every 6 (six) hours as needed. Patient used this medication for his migraine headache.    [provider]  cyclobenzaprine (FLEXERIL) 10 MG tablet Take 0.5-1 tablets (5-10 mg total) by mouth at bedtime  for 10 days. 03/31/22 04/10/22  Nira Conn, MD  esomeprazole (NEXIUM) 10 MG packet Take 10 mg by mouth daily before breakfast.    [provider]  lidocaine (LIDODERM) 5 % Place 1 patch onto the skin daily. Remove & Discard patch within 12 hours or as directed by MD 08/26/21   Henderly, Britni A, PA-C  naproxen (NAPROSYN) 500 MG tablet Take 1 tablet (500 mg total) by mouth 2 (two) times daily for 10 days. 03/31/22 04/10/22  Nira Conn, MD  oxyCODONE (ROXICODONE) 5 MG immediate release tablet Take 1 tablet (5 mg total) by mouth every 4 (four) hours as needed for severe pain. 08/26/21   Henderly, Britni A, PA-C    Family History History reviewed. No pertinent family history.  Social History Social History   Tobacco Use   Smoking status: Every Day    Packs/day: 1.50    Types: Cigarettes   Smokeless tobacco: Never  Substance Use Topics   Alcohol use: Not Currently    Comment: Occ   Drug use: Yes    Frequency: 2.0 times per week    Types: Marijuana     Allergies   Patient has no known allergies.   Review of Systems Review of Systems Per HPI  Physical Exam Triage Vital Signs ED Triage Vitals [04/04/22 1200]  Enc Vitals Group     BP 124/88  Pulse Rate 86     Resp 18     Temp (!) 97.2 F (36.2 C)     Temp src      SpO2 95 %     Weight      Height      Head Circumference      Peak Flow      Pain Score 10     Pain Loc      Pain Edu?      Excl. in [place]?    No data found.  Updated Vital Signs BP 124/88   Pulse 86   Temp (!) 97.2 F (36.2 C)   Resp 18   SpO2 95%   Visual Acuity Right Eye Distance:   Left Eye Distance:   Bilateral Distance:    Right Eye Near:   Left Eye Near:    Bilateral Near:     Physical Exam Constitutional:      General: He is not in acute distress.    Appearance: Normal appearance. He is not toxic-appearing or diaphoretic.  HENT:     Head: Normocephalic and atraumatic.  Eyes:     Extraocular  Movements: Extraocular movements intact.     Conjunctiva/sclera: Conjunctivae normal.  Pulmonary:     Effort: Pulmonary effort is normal.  Skin:    Comments: Patient has mild area of swelling with a scabbed over area that is approximately 1 inch in diameter present to left anterior shin about mid way up.  This is old injury. Patient has approximately 2 to 3 inch diameter area of swelling, erythema, or tenderness to palpation present directly to left of previous injury. No area of fluctuance. Patient can bear weight and is neurovascularly intact.  Neurological:     General: No focal deficit present.     Mental Status: He is alert and oriented to person, place, and time. Mental status is at baseline.  Psychiatric:        Mood and Affect: Mood normal.        Behavior: Behavior normal.        Thought Content: Thought content normal.        Judgment: Judgment normal.      UC Treatments / Results  Labs (all labs ordered are listed, but only abnormal results are displayed) Labs Reviewed - No data to display  EKG   Radiology DG Tibia/Fibula Left  Result Date: 04/04/2022 CLINICAL DATA:  Leg pain EXAM: LEFT TIBIA AND FIBULA - 2 VIEW COMPARISON:  Radiograph dated February 20, 2022 FINDINGS: There is no evidence of fracture or other focal bone lesions. Os trigonum. Mild degenerative changes of the partially visualized midfoot. Soft tissues are unremarkable. IMPRESSION: No acute osseous abnormality. Electronically Signed   By: Yetta Glassman M.D.   On: 04/04/2022 12:21    Procedures Procedures (including critical care time)  Medications Ordered in UC Medications - No data to display  Initial Impression / Assessment and Plan / UC Course  I have reviewed the triage vital signs and the nursing notes.  Pertinent labs & imaging results that were available during my care of the patient were reviewed by me and considered in my medical decision making (see chart for details).     X-ray was  completed given recent injury which was negative for any acute bony abnormality.  Patient does appear to have cellulitis of left leg which is new from old injury in September.  This is a new cellulitis related to injury.  Will treat  with doxycycline antibiotic.  Advised patient to take this medication with food.  There is no concern for systemic or worsening infection at this time.  I do think patient would need to see PCP for further evaluation and management.  PCP appointment was made on 10/13 for patient prior to discharge for follow-up.  Patient was advised to monitor leg very closely for any increased redness, swelling, pus, signs of infection and to follow-up if these occur.  Was advised to keep covered and about wound care as well.  He was given very strict return and ER precautions.  Patient verbalized understanding and was agreeable with plan. Final Clinical Impressions(s) / UC Diagnoses   Final diagnoses:  Cellulitis of left leg  Injury of left lower extremity, initial encounter     Discharge Instructions      X-ray was normal.  I have prescribed you additional antibiotics. please take medication with food.  Follow-up with primary care doctor and/or urgent care for further evaluation and management.  Monitor for worsening signs of infection that include increased redness, swelling, pus, fever and follow-up if they occur.     ED Prescriptions     Medication Sig Dispense Auth. Provider   doxycycline (VIBRAMYCIN) 100 MG capsule Take 1 capsule (100 mg total) by mouth 2 (two) times daily. 20 capsule Gustavus Bryant, [place]      PDMP not reviewed this encounter.   Gustavus Bryant, [place] 04/04/22 1248    Gustavus Bryant, [place] 04/04/22 1249

## 2022-04-06 ENCOUNTER — Ambulatory Visit: Payer: No Typology Code available for payment source | Admitting: Nurse Practitioner

## 2022-08-15 ENCOUNTER — Emergency Department (HOSPITAL_COMMUNITY)
Admission: EM | Admit: 2022-08-15 | Discharge: 2022-08-15 | Disposition: A | Discharge status: Home / Self Care | Payer: Medicaid Other | Attending: Emergency Medicine | Admitting: Emergency Medicine

## 2022-08-15 ENCOUNTER — Other Ambulatory Visit: Payer: Self-pay

## 2022-08-15 ENCOUNTER — Encounter (HOSPITAL_COMMUNITY): Payer: Self-pay | Admitting: Emergency Medicine

## 2022-08-15 ENCOUNTER — Emergency Department (HOSPITAL_COMMUNITY): Payer: Medicaid Other

## 2022-08-15 DIAGNOSIS — U071 COVID-19: Secondary | ICD-10-CM | POA: Diagnosis not present

## 2022-08-15 DIAGNOSIS — R509 Fever, unspecified: Secondary | ICD-10-CM | POA: Diagnosis not present

## 2022-08-15 DIAGNOSIS — R0789 Other chest pain: Secondary | ICD-10-CM | POA: Diagnosis not present

## 2022-08-15 DIAGNOSIS — R072 Precordial pain: Secondary | ICD-10-CM | POA: Diagnosis present

## 2022-08-15 DIAGNOSIS — Z7982 Long term (current) use of aspirin: Secondary | ICD-10-CM | POA: Insufficient documentation

## 2022-08-15 DIAGNOSIS — R079 Chest pain, unspecified: Secondary | ICD-10-CM | POA: Diagnosis not present

## 2022-08-15 LAB — RESP PANEL BY RT-PCR (RSV, FLU A&B, COVID)  RVPGX2
Influenza A by PCR: NEGATIVE
Influenza B by PCR: NEGATIVE
Resp Syncytial Virus by PCR: NEGATIVE
SARS Coronavirus 2 by RT PCR: POSITIVE — AB

## 2022-08-15 LAB — CBC
HCT: 42.2 % (ref 39.0–52.0)
Hemoglobin: 14.5 g/dL (ref 13.0–17.0)
MCH: 30.8 pg (ref 26.0–34.0)
MCHC: 34.4 g/dL (ref 30.0–36.0)
MCV: 89.6 fL (ref 80.0–100.0)
Platelets: 254 10*3/uL (ref 150–400)
RBC: 4.71 MIL/uL (ref 4.22–5.81)
RDW: 13.3 % (ref 11.5–15.5)
WBC: 8.6 10*3/uL (ref 4.0–10.5)
nRBC: 0 % (ref 0.0–0.2)

## 2022-08-15 LAB — BASIC METABOLIC PANEL
Anion gap: 9 (ref 5–15)
BUN: 19 mg/dL (ref 6–20)
CO2: 21 mmol/L — ABNORMAL LOW (ref 22–32)
Calcium: 8 mg/dL — ABNORMAL LOW (ref 8.9–10.3)
Chloride: 103 mmol/L (ref 98–111)
Creatinine, Ser: 1.1 mg/dL (ref 0.61–1.24)
GFR, Estimated: >60 mL/min (ref >60)
Glucose, Bld: 105 mg/dL — ABNORMAL HIGH (ref 70–99)
Potassium: 3.4 mmol/L — ABNORMAL LOW (ref 3.5–5.1)
Sodium: 133 mmol/L — ABNORMAL LOW (ref 135–145)

## 2022-08-15 LAB — TROPONIN I (HIGH SENSITIVITY): Troponin I (High Sensitivity): 3 ng/L (ref <18)

## 2022-08-15 LAB — LACTIC ACID, PLASMA: Lactic Acid, Venous: 0.7 mmol/L (ref 0.5–1.9)

## 2022-08-15 MED ORDER — SODIUM CHLORIDE 0.9 % IV BOLUS
1000.0000 mL | Freq: Once | INTRAVENOUS | Status: AC
  Administered 2022-08-15: 1000 mL via INTRAVENOUS

## 2022-08-15 MED ORDER — ALUM & MAG HYDROXIDE-SIMETH 200-200-20 MG/5ML PO SUSP
30.0000 mL | Freq: Once | ORAL | Status: AC
  Administered 2022-08-15: 30 mL via ORAL
  Filled 2022-08-15: qty 30

## 2022-08-15 NOTE — ED Notes (Signed)
Pt provided bag lunch

## 2022-08-15 NOTE — ED Notes (Signed)
Pt was provided a mask and educated on CDC recommendations for COVID

## 2022-08-15 NOTE — ED Triage Notes (Addendum)
[facility], central chest pain  that started tonight and fever.  3 SL nitro at jail, BP down to [id number] systolic after 0000000 ASA at jail  100.3 temp with ems

## 2022-08-15 NOTE — ED Notes (Signed)
Pt provided urinal at bedside

## 2022-08-15 NOTE — ED Notes (Signed)
Pt states he would like to leave d/t court appointment at 14:00. Pt says it is very important so he can get released. RN notified EDP

## 2022-08-15 NOTE — ED Notes (Signed)
Pt provided water per EDP request

## 2022-08-15 NOTE — ED Notes (Signed)
Pt provided ice water 

## 2022-08-15 NOTE — ED Notes (Signed)
RN resent lab work that was hemolyzed

## 2022-08-15 NOTE — ED Provider Notes (Signed)
[facility] Provider Note   CSN: [id number] Arrival date & time: 08/15/22  0630     History  Chief Complaint  Patient presents with   Chest Pain    Taylor Owen is a 42 y.o. male.  He has a history of heartburn.  He is in police custody after being incarcerated since yesterday.  He said he has not eaten anything and is very thirsty.  Complaining of burning pain in his chest and 1 episode of nonbloody vomitus since last evening.  He endorses smoking but denies alcohol or drugs and says he is not withdrawing from anything.  No diarrhea no urinary symptoms.  The history is provided by the patient.  Chest Pain Pain location:  Substernal area Pain quality: burning   Pain radiates to:  Does not radiate Pain severity:  Moderate Onset quality:  Gradual Duration:  1 day Timing:  Constant Progression:  Unchanged Chronicity:  New Relieved by:  None tried Worsened by:  Nothing Ineffective treatments:  None tried Associated symptoms: fever, nausea and vomiting   Associated symptoms: no abdominal pain, no cough and no shortness of breath   Risk factors: male sex and smoking        Home Medications Prior to Admission medications   Medication Sig Start Date End Date Taking? Authorizing Provider  acetaminophen (TYLENOL) 500 MG tablet Take 1,000 mg by mouth every 6 (six) hours as needed. Patient used this medication for his foot pain.    [provider]  aspirin-acetaminophen-caffeine (EXCEDRIN MIGRAINE) [phone] MG per tablet Take 2 tablets by mouth every 6 (six) hours as needed. Patient used this medication for his migraine headache.    [provider]  doxycycline (VIBRAMYCIN) 100 MG capsule Take 1 capsule (100 mg total) by mouth 2 (two) times daily. 04/04/22   Teodora Medici, FNP  esomeprazole (NEXIUM) 10 MG packet Take 10 mg by mouth daily before breakfast.    [provider]  lidocaine (LIDODERM) 5 % Place 1  patch onto the skin daily. Remove & Discard patch within 12 hours or as directed by MD 08/26/21   Henderly, Britni A, PA-C  oxyCODONE (ROXICODONE) 5 MG immediate release tablet Take 1 tablet (5 mg total) by mouth every 4 (four) hours as needed for severe pain. 08/26/21   Henderly, Britni A, PA-C      Allergies    Patient has no known allergies.    Review of Systems   Review of Systems  Constitutional:  Positive for fever.  HENT:  Negative for sore throat.   Respiratory:  Negative for cough and shortness of breath.   Cardiovascular:  Positive for chest pain.  Gastrointestinal:  Positive for nausea and vomiting. Negative for abdominal pain.  Genitourinary:  Negative for dysuria.  Skin:  Negative for rash.    Physical Exam Updated Vital Signs BP 106/68   Pulse (!) 101   Temp 100.1 F (37.8 C) (Oral)   Resp (!) 26   Ht 6' 3"$  (1.905 m)   Wt 72.6 kg   SpO2 94%   BMI 20.00 kg/m  Physical Exam Vitals and nursing note reviewed.  Constitutional:      General: He is not in acute distress.    Appearance: He is well-developed.  HENT:     Head: Normocephalic and atraumatic.  Eyes:     Conjunctiva/sclera: Conjunctivae normal.  Cardiovascular:     Rate and Rhythm: Regular rhythm. Tachycardia present.  Heart sounds: Normal heart sounds. No murmur heard. Pulmonary:     Effort: Pulmonary effort is normal. No respiratory distress.     Breath sounds: Normal breath sounds.  Abdominal:     Palpations: Abdomen is soft.     Tenderness: There is no abdominal tenderness.  Musculoskeletal:        General: No swelling.     Cervical back: Neck supple.     Right lower leg: No tenderness. No edema.     Left lower leg: No tenderness. No edema.  Skin:    General: Skin is warm and dry.     Capillary Refill: Capillary refill takes less than 2 seconds.  Neurological:     General: No focal deficit present.     Mental Status: He is alert.     ED Results / Procedures / Treatments   Labs (all  labs ordered are listed, but only abnormal results are displayed) Labs Reviewed  RESP PANEL BY RT-PCR (RSV, FLU A&B, COVID)  RVPGX2 - Abnormal; Notable for the following components:      Result Value   SARS Coronavirus 2 by RT PCR POSITIVE (*)    All other components within normal limits  BASIC METABOLIC PANEL - Abnormal; Notable for the following components:   Sodium 133 (*)    Potassium 3.4 (*)    CO2 21 (*)    Glucose, Bld 105 (*)    Calcium 8.0 (*)    All other components within normal limits  CULTURE, BLOOD (ROUTINE X 2)  CULTURE, BLOOD (ROUTINE X 2)  CBC  LACTIC ACID, PLASMA  TROPONIN I (HIGH SENSITIVITY)    EKG EKG Interpretation  Date/Time:  Wednesday August 15 2022 06:34:22 EST Ventricular Rate:  95 PR Interval:  141 QRS Duration: 99 QT Interval:  339 QTC Calculation: 427 R Axis:   72 Text Interpretation: Sinus rhythm No significant change since prior 7/11 Confirmed by Aletta Edouard [phone]) on 08/15/2022 7:51:52 AM  Radiology DG Chest 2 View  Result Date: 08/15/2022 CLINICAL DATA:  Chest pain EXAM: CHEST - 2 VIEW COMPARISON:  08/26/2021 chest radiograph. FINDINGS: Stable cardiomediastinal silhouette with normal heart size. No pneumothorax. No pleural effusion. Mild streaky bibasilar scarring versus atelectasis. No pulmonary edema. No acute consolidative airspace disease. IMPRESSION: Mild bibasilar scarring versus atelectasis. Otherwise no active cardiopulmonary disease. Electronically Signed   By: Ilona Sorrel M.D.   On: 08/15/2022 07:30    Procedures Procedures    Medications Ordered in ED Medications  sodium chloride 0.9 % bolus 1,000 mL (0 mLs Intravenous Stopped 08/15/22 0941)  alum & mag hydroxide-simeth (MAALOX/MYLANTA) 200-200-20 MG/5ML suspension 30 mL (30 mLs Oral Given 08/15/22 [id number])    ED Course/ Medical Decision Making/ A&P Clinical Course as of 08/15/22 1657  Wed Aug 15, 2022  0759 Chest x-ray interpreted by me as no acute infiltrate.  Awaiting  radiology reading. [MB]  1132 Patient states he has a court date at 2 PM I would like to be discharged.  His second troponin has not been run yet but he says he is pain-free now.  This is unlikely to be ischemic chest pain as it has been going on for over 8 hours with first troponin of 3.  Chest pain is resolved now. [MB]    Clinical Course User Index [MB] Hayden Rasmussen, MD  Medical Decision Making Amount and/or Complexity of Data Reviewed Labs: ordered. Radiology: ordered.  Risk OTC drugs.   This patient complains of burning chest pain nausea vomiting; this involves an extensive number of treatment Options and is a complaint that carries with it a high risk of complications and morbidity. The differential includes heartburn, ACS, infection, pneumonia, pneumothorax, PE  I ordered, reviewed and interpreted labs, which included CBC with normal white count normal hemoglobin, chemistries with low potassium low bicarb, COVID-positive, lactate normal, troponin unremarkable I ordered medication oral Maalox with improvement in his symptoms IV fluids and reviewed PMP when indicated. I ordered imaging studies which included chest x-ray and I independently    visualized and interpreted imaging which showed atelectasis versus scarring Previous records obtained and reviewed in epic no recent admissions  Cardiac monitoring reviewed, normal sinus rhythm Social determinants considered, ongoing tobacco use and police custody Critical Interventions: None  After the interventions stated above, I reevaluated the patient and found patient's pain to be resolved and wanted to be discharged Admission and further testing considered, would like to get a second troponin but patient unwilling to wait.  I think the patient is appropriate for discharge satting well on room air in no distress.  He is unwilling to start on any antiviral medication.  Recommended close follow-up with PCP  and return instructions discussed.         Final Clinical Impression(s) / ED Diagnoses Final diagnoses:  Nonspecific chest pain  COVID-19 virus infection    Rx / DC Orders ED Discharge Orders     None         Hayden Rasmussen, MD 08/15/22 1700

## 2022-08-15 NOTE — Discharge Instructions (Signed)
You were seen in the emergency department for evaluation of chest pain.  Your symptoms resolved here and your initial workup did not show any evidence of heart injury.  You did test positive for COVID and so to isolate and wear a mask.  Follow-up with your regular doctor.  Return to the emergency department if any worsening or concerning symptoms

## 2022-08-17 ENCOUNTER — Ambulatory Visit
Admission: EM | Admit: 2022-08-17 | Discharge: 2022-08-17 | Disposition: A | Discharge status: Home / Self Care | Payer: Medicaid Other | Attending: Internal Medicine | Admitting: Internal Medicine

## 2022-08-17 ENCOUNTER — Ambulatory Visit (INDEPENDENT_AMBULATORY_CARE_PROVIDER_SITE_OTHER): Payer: Medicaid Other

## 2022-08-17 DIAGNOSIS — U071 COVID-19: Secondary | ICD-10-CM | POA: Diagnosis not present

## 2022-08-17 DIAGNOSIS — R079 Chest pain, unspecified: Secondary | ICD-10-CM

## 2022-08-17 DIAGNOSIS — R059 Cough, unspecified: Secondary | ICD-10-CM

## 2022-08-17 DIAGNOSIS — R0602 Shortness of breath: Secondary | ICD-10-CM | POA: Diagnosis not present

## 2022-08-17 MED ORDER — PREDNISONE 20 MG PO TABS: 40.0000 mg | ORAL_TABLET | Freq: Every day | ORAL | 0 refills | Status: AC

## 2022-08-17 NOTE — ED Provider Notes (Signed)
[facility]    CSN: [id number] Arrival date & time: 08/17/22  1618      History   Chief Complaint Chief Complaint  Patient presents with   Chest Pain   Shortness of Breath    HPI Taylor Owen is a 42 y.o. male.   Patient presents for further evaluation of chest pain, shortness of breath, nasal congestion, cough.  Patient was seen in the ED when symptoms first started on 2/21 and tested positive for COVID-19.  He states that he was having chest tightness at that time, but chest discomfort has worsened since being seen the ED.  He reports that it is now sharp pain that feels like "someone is twisting a knife in his chest".  Reports intermittent shortness of breath.  Denies history of asthma but patient does smoke cigarettes and has smoked cigarettes since he was 42 years old.  Denies any recent fevers.  Patient states that he was given medications in jail as he was incarcerated during the ED visit.  He states that he is not sure the name of the medications.   Chest Pain Shortness of Breath   Past Medical History:  Diagnosis Date   GERD (gastroesophageal reflux disease)     There are no problems to display for this patient.   History reviewed. No pertinent surgical history.     Home Medications    Prior to Admission medications   Medication Sig Start Date End Date Taking? Authorizing Provider  acetaminophen (TYLENOL) 500 MG tablet Take 1,000 mg by mouth every 6 (six) hours as needed. Patient used this medication for his foot pain.   Yes [provider]  predniSONE (DELTASONE) 20 MG tablet Take 2 tablets (40 mg total) by mouth daily for 5 days. 08/17/22 08/22/22 Yes Keshawna Dix, Michele Rockers, FNP  aspirin-acetaminophen-caffeine (EXCEDRIN MIGRAINE) [phone] MG per tablet Take 2 tablets by mouth every 6 (six) hours as needed. Patient used this medication for his migraine headache.    [provider]  doxycycline (VIBRAMYCIN) 100 MG capsule Take 1 capsule  (100 mg total) by mouth 2 (two) times daily. 04/04/22   Teodora Medici, FNP  esomeprazole (NEXIUM) 10 MG packet Take 10 mg by mouth daily before breakfast.    [provider]  lidocaine (LIDODERM) 5 % Place 1 patch onto the skin daily. Remove & Discard patch within 12 hours or as directed by MD 08/26/21   Henderly, Britni A, PA-C  oxyCODONE (ROXICODONE) 5 MG immediate release tablet Take 1 tablet (5 mg total) by mouth every 4 (four) hours as needed for severe pain. 08/26/21   Henderly, Britni A, PA-C    Family History History reviewed. No pertinent family history.  Social History Social History   Tobacco Use   Smoking status: Every Day    Packs/day: 1.50    Types: Cigarettes   Smokeless tobacco: Never  Substance Use Topics   Alcohol use: Not Currently    Comment: Occ   Drug use: Yes    Frequency: 2.0 times per week    Types: Marijuana     Allergies   Patient has no known allergies.   Review of Systems Review of Systems Per HPI  Physical Exam Triage Vital Signs ED Triage Vitals  Enc Vitals Group     BP 08/17/22 1702 129/85     Pulse Rate 08/17/22 1702 (!) 110     Resp 08/17/22 1702 18     Temp 08/17/22 1702 98.2 F (36.8 C)  Temp src --      SpO2 08/17/22 1702 95 %     Weight --      Height --      Head Circumference --      Peak Flow --      Pain Score 08/17/22 1704 10     Pain Loc --      Pain Edu? --      Excl. in [place]? --    No data found.  Updated Vital Signs BP 129/85 (BP Location: Left Arm)   Pulse 96   Temp 98.2 F (36.8 C)   Resp 18   SpO2 95%   Visual Acuity Right Eye Distance:   Left Eye Distance:   Bilateral Distance:    Right Eye Near:   Left Eye Near:    Bilateral Near:     Physical Exam Constitutional:      General: He is not in acute distress.    Appearance: Normal appearance. He is not toxic-appearing or diaphoretic.  HENT:     Head: Normocephalic and atraumatic.     Right Ear: Tympanic membrane and ear canal normal.      Left Ear: Tympanic membrane and ear canal normal.     Nose: Congestion present.     Mouth/Throat:     Mouth: Mucous membranes are moist.     Pharynx: No posterior oropharyngeal erythema.  Eyes:     Extraocular Movements: Extraocular movements intact.     Conjunctiva/sclera: Conjunctivae normal.     Pupils: Pupils are equal, round, and reactive to light.  Cardiovascular:     Rate and Rhythm: Normal rate and regular rhythm.     Pulses: Normal pulses.     Heart sounds: Normal heart sounds.  Pulmonary:     Effort: Pulmonary effort is normal. No respiratory distress.     Breath sounds: Normal breath sounds. No stridor. No wheezing, rhonchi or rales.  Abdominal:     General: Abdomen is flat. Bowel sounds are normal.     Palpations: Abdomen is soft.  Musculoskeletal:        General: Normal range of motion.     Cervical back: Normal range of motion.  Skin:    General: Skin is warm and dry.  Neurological:     General: No focal deficit present.     Mental Status: He is alert and oriented to person, place, and time. Mental status is at baseline.  Psychiatric:        Mood and Affect: Mood normal.        Behavior: Behavior normal.      UC Treatments / Results  Labs (all labs ordered are listed, but only abnormal results are displayed) Labs Reviewed  BASIC METABOLIC PANEL    EKG   Radiology DG Chest 2 View  Result Date: 08/17/2022 CLINICAL DATA:  Cough. Tested positive for COVID 08/14/2022. Shortness of breath. EXAM: CHEST - 2 VIEW COMPARISON:  Chest two views 08/15/2022 FINDINGS: Cardiac silhouette and mediastinal contours are within normal limits. The lungs are clear. No pleural effusion or pneumothorax. No acute skeletal abnormality. IMPRESSION: No active cardiopulmonary disease. Electronically Signed   By: Yvonne Kendall M.D.   On: 08/17/2022 17:51    Procedures Procedures (including critical care time)  Medications Ordered in UC Medications - No data to  display  Initial Impression / Assessment and Plan / UC Course  I have reviewed the triage vital signs and the nursing notes.  Pertinent labs & imaging results  that were available during my care of the patient were reviewed by me and considered in my medical decision making (see chart for details).     Patient tested positive for COVID-19 at recent ED visit.  He declined COVID antivirals at that time per ED note.  EKG was repeated today given worsening and change in chest pain.  It was unremarkable and unchanged especially when compared to previous EKGs.  Low suspicion for cardiac etiology given that troponin at recent ED visit was negative, EKG is normal, and symptoms are most likely related to COVID-19.  I discussed with patient limited workup here in urgent care for to rule out cardiac etiology and he voiced understanding and accepted risks.  Chest x-ray completed that was negative for any acute cardiopulmonary process.  Will repeat BMP given that sodium and potassium were mildly decreased at previous ED visit.  I do think patient would benefit from prednisone to decrease inflammation in chest so this was prescribed for patient.  Patient reports he has albuterol inhaler at home so encouraged continued use of this as needed.  It does not appear the patient was on prednisone for symptoms after further review of the chart.  Therefore, this should be safe.  Advised strict return and ER precautions.  Patient verbalized understanding and was agreeable with plan. Final Clinical Impressions(s) / UC Diagnoses   Final diagnoses:  [id number]  Chest pain, unspecified type     Discharge Instructions      I have prescribed prednisone to help alleviate symptoms.  Continue albuterol inhaler.  Follow-up with the ER if symptoms persist or worsen.     ED Prescriptions     Medication Sig Dispense Auth. Provider   predniSONE (DELTASONE) 20 MG tablet Take 2 tablets (40 mg total) by mouth daily for 5 days. 10  tablet Teodora Medici, [place]      PDMP not reviewed this encounter.   Teodora Medici, [place] 08/17/22 [phone]

## 2022-08-17 NOTE — ED Triage Notes (Signed)
Pt tested Covid positive  on 02/202024 , pt complains of SOB, weakness , chest feels tight x 3days . Pt needs refill on meds

## 2022-08-17 NOTE — Discharge Instructions (Signed)
I have prescribed prednisone to help alleviate symptoms.  Continue albuterol inhaler.  Follow-up with the ER if symptoms persist or worsen.

## 2022-08-18 LAB — BASIC METABOLIC PANEL
BUN/Creatinine Ratio: 10 (ref 9–20)
BUN: 11 mg/dL (ref 6–24)
CO2: 23 mmol/L (ref 20–29)
Calcium: 9.3 mg/dL (ref 8.7–10.2)
Chloride: 104 mmol/L (ref 96–106)
Creatinine, Ser: 1.1 mg/dL (ref 0.76–1.27)
Glucose: 76 mg/dL (ref 70–99)
Potassium: 4.2 mmol/L (ref 3.5–5.2)
Sodium: 143 mmol/L (ref 134–144)
eGFR: 85 mL/min/{1.73_m2} (ref >59)

## 2022-08-20 ENCOUNTER — Telehealth: Payer: Self-pay

## 2022-08-20 LAB — CULTURE, BLOOD (ROUTINE X 2)
Culture: NO GROWTH
Culture: NO GROWTH
Special Requests: ADEQUATE
Special Requests: ADEQUATE

## 2022-08-20 NOTE — Telephone Encounter (Signed)
Pt given lab results per notes of Dallie Dad, RN on 08/20/22. Pt verbalized understanding.     Curt Jews, RN 08/20/2022  7:51 AM EST Back to Top    Reviewed, WNL

## 2023-01-28 ENCOUNTER — Emergency Department (HOSPITAL_COMMUNITY)
Admission: EM | Admit: 2023-01-28 | Discharge: 2023-01-28 | Disposition: A | Discharge status: Home / Self Care | Payer: Medicaid Other | Source: Home / Self Care | Attending: Emergency Medicine | Admitting: Emergency Medicine

## 2023-01-28 ENCOUNTER — Encounter (HOSPITAL_COMMUNITY): Payer: Self-pay

## 2023-01-28 ENCOUNTER — Other Ambulatory Visit: Payer: Self-pay

## 2023-01-28 ENCOUNTER — Emergency Department (HOSPITAL_COMMUNITY): Payer: Medicaid Other

## 2023-01-28 DIAGNOSIS — D1803 Hemangioma of intra-abdominal structures: Secondary | ICD-10-CM

## 2023-01-28 DIAGNOSIS — R112 Nausea with vomiting, unspecified: Secondary | ICD-10-CM | POA: Insufficient documentation

## 2023-01-28 DIAGNOSIS — D72829 Elevated white blood cell count, unspecified: Secondary | ICD-10-CM | POA: Insufficient documentation

## 2023-01-28 DIAGNOSIS — R7401 Elevation of levels of liver transaminase levels: Secondary | ICD-10-CM

## 2023-01-28 DIAGNOSIS — R7989 Other specified abnormal findings of blood chemistry: Secondary | ICD-10-CM | POA: Diagnosis not present

## 2023-01-28 LAB — COMPREHENSIVE METABOLIC PANEL
ALT: 64 U/L — ABNORMAL HIGH (ref 0–44)
AST: 46 U/L — ABNORMAL HIGH (ref 15–41)
Albumin: 3.6 g/dL (ref 3.5–5.0)
Alkaline Phosphatase: 62 U/L (ref 38–126)
Anion gap: 11 (ref 5–15)
BUN: 12 mg/dL (ref 6–20)
CO2: 23 mmol/L (ref 22–32)
Calcium: 8.5 mg/dL — ABNORMAL LOW (ref 8.9–10.3)
Chloride: 101 mmol/L (ref 98–111)
Creatinine, Ser: 1.22 mg/dL (ref 0.61–1.24)
GFR, Estimated: >60 mL/min (ref >60)
Glucose, Bld: 101 mg/dL — ABNORMAL HIGH (ref 70–99)
Potassium: 3.6 mmol/L (ref 3.5–5.1)
Sodium: 135 mmol/L (ref 135–145)
Total Bilirubin: 0.9 mg/dL (ref 0.3–1.2)
Total Protein: 6.6 g/dL (ref 6.5–8.1)

## 2023-01-28 LAB — URINALYSIS, ROUTINE W REFLEX MICROSCOPIC
Bacteria, UA: NONE SEEN
Bilirubin Urine: NEGATIVE
Glucose, UA: NEGATIVE mg/dL
Hgb urine dipstick: NEGATIVE
Ketones, ur: NEGATIVE mg/dL
Leukocytes,Ua: NEGATIVE
Nitrite: NEGATIVE
Protein, ur: 100 mg/dL — AB
Specific Gravity, Urine: 1.025 (ref 1.005–1.030)
pH: 5 (ref 5.0–8.0)

## 2023-01-28 LAB — CBC
HCT: 41.6 % (ref 39.0–52.0)
Hemoglobin: 14.7 g/dL (ref 13.0–17.0)
MCH: 30.7 pg (ref 26.0–34.0)
MCHC: 35.3 g/dL (ref 30.0–36.0)
MCV: 86.8 fL (ref 80.0–100.0)
Platelets: 218 10*3/uL (ref 150–400)
RBC: 4.79 MIL/uL (ref 4.22–5.81)
RDW: 13.2 % (ref 11.5–15.5)
WBC: 11 10*3/uL — ABNORMAL HIGH (ref 4.0–10.5)
nRBC: 0 % (ref 0.0–0.2)

## 2023-01-28 LAB — LIPASE, BLOOD: Lipase: 31 U/L (ref 11–51)

## 2023-01-28 MED ORDER — ONDANSETRON HCL 4 MG PO TABS: 4.0000 mg | ORAL_TABLET | Freq: Four times a day (QID) | ORAL | 0 refills | Status: AC

## 2023-01-28 MED ORDER — ONDANSETRON HCL 4 MG/2ML IJ SOLN
4.0000 mg | Freq: Once | INTRAMUSCULAR | Status: AC
  Administered 2023-01-28: 4 mg via INTRAVENOUS
  Filled 2023-01-28: qty 2

## 2023-01-28 MED ORDER — LACTATED RINGERS IV BOLUS
1000.0000 mL | Freq: Once | INTRAVENOUS | Status: AC
  Administered 2023-01-28: 1000 mL via INTRAVENOUS

## 2023-01-28 NOTE — Discharge Instructions (Signed)
You were seen in the emergency department for your nausea and vomiting.  Your liver enzymes are mildly elevated but you had no signs of severe dehydration or gallbladder disease.  You likely have a viral syndrome causing your symptoms.  I have given you Zofran that you continue to take as needed for nausea.  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  You should return to the emergency department if you have significantly worsening pain, repetitive vomiting despite the nausea medicine, persistent fevers or any other new or concerning symptoms.

## 2023-01-28 NOTE — ED Provider Notes (Signed)
[place] EMERGENCY DEPARTMENT AT [facility] Provider Note   CSN: [id number] Arrival date & time: 01/28/23  1206     History  Chief Complaint  Patient presents with   Emesis    Taylor Owen is a 42 y.o. male.  Patient is a 42 year old male with no significant past medical history presenting to the emergency department with nausea and vomiting.  Patient states that he has been vomiting since Friday.  He states that he did have a fever earlier today but has been otherwise afebrile.  He states that he has had a mild nonproductive cough.  He denies any congestion or sore throat.  He denies any associated abdominal pain or diarrhea.  He states that he quit marijuana about 1 month ago but has no history of cannabinoid hyperemesis.  Denies any alcohol use.  Denies any prior abdominal surgeries.  The history is provided by the patient.  Emesis      Home Medications Prior to Admission medications   Medication Sig Start Date End Date Taking? Authorizing Provider  ondansetron (ZOFRAN) 4 MG tablet Take 1 tablet (4 mg total) by mouth every 6 (six) hours. 01/28/23  Yes Elayne Snare K, DO  acetaminophen (TYLENOL) 500 MG tablet Take 1,000 mg by mouth every 6 (six) hours as needed. Patient used this medication for his foot pain.    [provider]  aspirin-acetaminophen-caffeine (EXCEDRIN MIGRAINE) [phone] MG per tablet Take 2 tablets by mouth every 6 (six) hours as needed. Patient used this medication for his migraine headache.    [provider]  doxycycline (VIBRAMYCIN) 100 MG capsule Take 1 capsule (100 mg total) by mouth 2 (two) times daily. 04/04/22   Gustavus Bryant, FNP  esomeprazole (NEXIUM) 10 MG packet Take 10 mg by mouth daily before breakfast.    [provider]  lidocaine (LIDODERM) 5 % Place 1 patch onto the skin daily. Remove & Discard patch within 12 hours or as directed by MD 08/26/21   Henderly, Britni A, PA-C  oxyCODONE (ROXICODONE) 5  MG immediate release tablet Take 1 tablet (5 mg total) by mouth every 4 (four) hours as needed for severe pain. 08/26/21   Henderly, Britni A, PA-C      Allergies    Patient has no known allergies.    Review of Systems   Review of Systems  Gastrointestinal:  Positive for vomiting.    Physical Exam Updated Vital Signs BP 108/71 (BP Location: Left Arm)   Pulse 79   Temp 99.3 F (37.4 C) (Oral)   Resp 13   Ht 6\' 3"  (1.905 m)   Wt 72.6 kg   SpO2 99%   BMI 20.01 kg/m  Physical Exam Vitals and nursing note reviewed.  Constitutional:      General: He is not in acute distress.    Appearance: Normal appearance.  HENT:     Head: Normocephalic and atraumatic.     Nose: Nose normal.     Mouth/Throat:     Mouth: Mucous membranes are moist.     Pharynx: Oropharynx is clear.  Eyes:     Extraocular Movements: Extraocular movements intact.     Conjunctiva/sclera: Conjunctivae normal.  Cardiovascular:     Rate and Rhythm: Normal rate and regular rhythm.     Heart sounds: Normal heart sounds.  Pulmonary:     Effort: Pulmonary effort is normal.     Breath sounds: Normal breath sounds.  Abdominal:     General: Abdomen is flat.  Palpations: Abdomen is soft.     Tenderness: There is no abdominal tenderness.  Musculoskeletal:        General: Normal range of motion.     Cervical back: Normal range of motion.  Skin:    General: Skin is warm and dry.  Neurological:     General: No focal deficit present.     Mental Status: He is alert and oriented to person, place, and time.  Psychiatric:        Mood and Affect: Mood normal.        Behavior: Behavior normal.     ED Results / Procedures / Treatments   Labs (all labs ordered are listed, but only abnormal results are displayed) Labs Reviewed  COMPREHENSIVE METABOLIC PANEL - Abnormal; Notable for the following components:      Result Value   Glucose, Bld 101 (*)    Calcium 8.5 (*)    AST 46 (*)    ALT 64 (*)    All other  components within normal limits  CBC - Abnormal; Notable for the following components:   WBC 11.0 (*)    All other components within normal limits  URINALYSIS, ROUTINE W REFLEX MICROSCOPIC - Abnormal; Notable for the following components:   Color, Urine AMBER (*)    APPearance CLOUDY (*)    Protein, ur 100 (*)    Non Squamous Epithelial 0-5 (*)    All other components within normal limits  LIPASE, BLOOD    EKG None  Radiology US Abdomen Limited RUQ (LIVER/GB)  Result Date: 01/28/2023 CLINICAL DATA:  Elevated liver function tests EXAM: ULTRASOUND ABDOMEN LIMITED RIGHT UPPER QUADRANT COMPARISON:  None Available. FINDINGS: Gallbladder: No gallstones or wall thickening visualized. No sonographic Murphy sign noted by sonographer. Common bile duct: Diameter: 2 mm Liver: Nonshadowing ovoid echogenicity in the right hepatic lobe measures 1.1 x 0.9 x 0.7 cm. Portal vein is patent on color Doppler imaging with normal direction of blood flow towards the liver. Other: None. IMPRESSION: 1. Nonshadowing ovoid echogenicity in the right hepatic lobe measures 1.1 x 0.9 x 0.7 cm. This is favored to represent a hemangioma. Consider further evaluation with nonemergent liver protocol MRI with and without contrast. 2. No evidence of cholelithiasis or acute cholecystitis. Electronically Signed   By: Agustin Cree M.D.   On: 01/28/2023 16:42    Procedures Procedures    Medications Ordered in ED Medications  ondansetron (ZOFRAN) injection 4 mg (4 mg Intravenous Given 01/28/23 1612)  lactated ringers bolus 1,000 mL (0 mLs Intravenous Stopped 01/28/23 1732)    ED Course/ Medical Decision Making/ A&P Clinical Course as of 01/28/23 2351  Mon Jan 28, 2023  1649 Possible hemangioma on RUQ [place]. Will be recommended outpatient follow up. Otherwise within normal range. He is stable for discharge. [VK]    Clinical Course User Index [VK] Rexford Maus, DO                                 Medical Decision  Making This patient presents to the ED with chief complaint(s) of N/V with no pertinent past medical history which further complicates the presenting complaint. The complaint involves an extensive differential diagnosis and also carries with it a high risk of complications and morbidity.    The differential diagnosis includes gastritis, pancreatitis, hepatitis, viral syndrome, dehydration, electrolyte abnormality, cholelithiasis, cholecystitis  Additional history obtained: Additional history obtained from N/A Records reviewed N/A  ED Course and Reassessment: On patient's arrival to the emergency department he is hemodynamically stable in no acute distress.  He was initially evaluated by triage and had labs and urine performed.  Labs showed mild transaminitis and mild leukocytosis, otherwise within normal range without signs of severe dehydration.  Patient will right upper quadrant ultrasound performed to evaluate for possible hepatobiliary pathology as a cause of his symptoms.  He will be started on fluids and given Zofran for symptomatic management and will be closely reassessed.  Independent labs interpretation:  The following labs were independently interpreted: Mild leukocytosis, transaminitis  Independent visualization of imaging: - I independently visualized the following imaging with scope of interpretation limited to determining acute life threatening conditions related to emergency care: RUQ [place], which revealed hemangioma, otherwise no acute disease  Consultation: - Consulted or discussed management/test interpretation w/ external professional: N/A  Consideration for admission or further workup: Patient has no emergent conditions requiring admission or further work-up at this time and is stable for discharge home with primary care follow-up  Social Determinants of health: N/A    Amount and/or Complexity of Data Reviewed Labs: ordered. Radiology: ordered.  Risk Prescription  drug management.          Final Clinical Impression(s) / ED Diagnoses Final diagnoses:  Nausea and vomiting, unspecified vomiting type  Transaminitis  Hemangioma of liver    Rx / DC Orders ED Discharge Orders          Ordered    ondansetron (ZOFRAN) 4 MG tablet  Every 6 hours        01/28/23 1716              Rexford Maus, DO 01/28/23 2351

## 2023-01-28 NOTE — ED Triage Notes (Signed)
Pt c/o N/Vx3d. Pt states he's unable to keep anything down

## 2023-01-31 ENCOUNTER — Inpatient Hospital Stay: Payer: Medicaid Other | Admitting: Internal Medicine

## 2023-11-07 ENCOUNTER — Ambulatory Visit (HOSPITAL_COMMUNITY)
Admission: EM | Admit: 2023-11-07 | Discharge: 2023-11-07 | Disposition: A | Discharge status: Home / Self Care | Attending: Psychiatry | Admitting: Psychiatry

## 2023-11-07 DIAGNOSIS — F1510 Other stimulant abuse, uncomplicated: Secondary | ICD-10-CM | POA: Insufficient documentation

## 2023-11-07 DIAGNOSIS — Z760 Encounter for issue of repeat prescription: Secondary | ICD-10-CM | POA: Insufficient documentation

## 2023-11-07 DIAGNOSIS — F909 Attention-deficit hyperactivity disorder, unspecified type: Secondary | ICD-10-CM | POA: Insufficient documentation

## 2023-11-07 DIAGNOSIS — Z79899 Other long term (current) drug therapy: Secondary | ICD-10-CM

## 2023-11-07 NOTE — ED Provider Notes (Signed)
 Behavioral Health Urgent Care Medical Screening Exam  Patient Name: Taylor Owen MRN: [id number] Date of Evaluation: 11/07/23 Chief Complaint: medication management Diagnosis:  Final diagnoses:  Medication management   History of Present illness: Revan Derricks is a 43 y.o. male with a prior reported mental health history of ADHD who presents to the [facility] behavioral health center today requesting medication management for ADHD.  Assessment: During encounter, pt presents with flat affect and depressed mood, attention to personal hygiene and grooming is fair, eye contact is good, speech is clear & coherent. Thought contents are organized and logical, and pt currently denies SI/HI/AVH or paranoia. There is no evidence of delusional thoughts.  Denies first rank symptoms and there are no overt signs of psychosis.  Pt reports that he is here because his probation officer sent him to get medication management for his mental health rather than using substances of abuse; pt reports that he currently purchases Adderall from the streets, and has been using 30 mg daily, shares that he was diagnosed with ADHD at a younger age, and is unable to focus. 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 Pt states that he uses THC, but only sometimes, and not frequently. He denies any other substance use other than those mentioned above.   Pt denies depressive symptoms, denies anxiety symptoms, denies symptoms significant for OCD, psychosis, specifically denies any history auditory of visual hallucinations.   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   Recommendations:  Follow up with [facility] Residents Only  Walk-in hours for open access (medication management and therapy) are Monday - Friday 8 am to 11  am. Appointments are limited, so please arrive at 7:00 am. Upon arrival, please complete the form on the clipboard located at the front desk. If there are no clipboards available, all appointments have been filled for that day.  [facility] [address] [place]  [phone]   Suicide Risk: Minimal: No identifiable suicidal ideation.  Patients presenting with no risk factors but with morbid ruminations; may be classified as minimal risk based on the severity of the depressive symptoms   Flowsheet Row ED from 11/07/2023 in [facility] ED from 01/28/2023 in [facility] Emergency Department at [facility] UC from 04/04/2022 in [facility] at [facility])  C-SSRS RISK CATEGORY No Risk No Risk No Risk       Psychiatric Specialty Exam  Presentation  General Appearance:Fairly Groomed  Eye Contact:Fair  Speech:No data recorded Speech Volume:Normal  Handedness:Right   Mood and Affect  Mood:Depressed; Anxious  Affect:Congruent   Thought Process  Thought Processes:Coherent  Descriptions of Associations:Intact  Orientation:Full (Time, Place and Person)  Thought Content:Logical    Hallucinations:None  Ideas of Reference:None  Suicidal Thoughts:No  Homicidal Thoughts:No   Sensorium  Memory:Immediate Fair  Judgment:Fair  Insight:Fair   Executive Functions  Concentration:Fair  Attention Span:Fair  Recall:Fair  Fund of Knowledge:Fair  Language:Fair   Psychomotor Activity  Psychomotor Activity:Normal   Assets  Assets:Housing; Resilience   Sleep  Sleep:Fair  Number of hours: No data recorded  Physical Exam: Physical Exam Constitutional:      Appearance: Normal appearance.  Musculoskeletal:        General: Normal range of motion.  Cervical back: Normal range of motion.  Neurological:     General: No focal deficit present.     Mental  Status: He is alert and oriented to person, place, and time.    Review of Systems  Psychiatric/Behavioral:  Positive for substance abuse. Negative for depression, hallucinations, memory loss and suicidal ideas. The patient is nervous/anxious. The patient does not have insomnia.   All other systems reviewed and are negative.  Blood pressure (!) 130/90, pulse 74, temperature (!) 97.5 F (36.4 C), temperature source Oral, resp. rate 17, SpO2 98%. There is no height or weight on file to calculate BMI.  Musculoskeletal: Strength & Muscle Tone: within normal limits Gait & Station: normal Patient leans: N/A   [facility] MSE Discharge Disposition for Follow up and Recommendations: Based on my evaluation the patient does not appear to have an emergency medical condition and can be discharged with resources and follow up care in outpatient services for Medication Management and Individual TherapyRecommended to present back to this location for medication management and therapy as above.  Education provided on the fact that if experiencing worsening of psychiatry symptoms including suicidal ideations, homicidal ideations, or having auditory/visual hallucinations, etc, to call 911, [phone], come back to this location, or go to the nearest ER. Pt verbalized understanding,.  Robet Chiquito, NP 11/07/2023, 3:23 PM
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
## 2023-11-07 NOTE — Discharge Instructions (Addendum)
 Follow up with [facility] Residents Only  Walk-in hours for open access (medication management and therapy) are Monday - Friday 8 am to 11 am. Appointments are limited, so please arrive by 6:30 am. Upon arrival, please complete the form on the clipboard located at the front desk. If there are no clipboards available, all appointments have been filled for that day.  [facility] [address] [place]  [id number] [phone]

## 2023-11-08 ENCOUNTER — Ambulatory Visit (HOSPITAL_COMMUNITY): Admitting: Physician Assistant

## 2023-11-08 ENCOUNTER — Encounter (HOSPITAL_COMMUNITY): Payer: Self-pay | Admitting: Physician Assistant

## 2023-11-08 VITALS — BP 133/95 | HR 82 | Temp 97.8°F | Ht 75.0 in | Wt 182.6 lb

## 2023-11-08 DIAGNOSIS — Z8659 Personal history of other mental and behavioral disorders: Secondary | ICD-10-CM | POA: Diagnosis not present

## 2023-11-08 MED ORDER — ATOMOXETINE HCL 18 MG PO CAPS: 18.0000 mg | ORAL_CAPSULE | Freq: Two times a day (BID) | ORAL | 1 refills | Status: AC

## 2023-11-08 NOTE — Progress Notes (Signed)
 Psychiatric Initial Adult Assessment   Patient Identification: Taylor Owen MRN:  [id number] Date of Evaluation:  11/08/2023 Referral Source: Walk-in/referred by [facility] Chief Complaint:   Chief Complaint  Patient presents with   Establish Care   Medication Management   Visit Diagnosis:    ICD-10-CM   1. History of ADHD  Z86.59 atomoxetine  (STRATTERA ) 18 MG capsule     History of Present Illness:    Taylor Owen is a 43 year old male with a self-reported history of ADHD who presents to [facility] to establish psychiatric care and for medication management.  Patient presents to the encounter stating that his probation officer instructed him to get an evaluation done.  Patient reports that he had a previous psychiatric provider as a child but denies having one now.  Patient reports that his main concern is his inability to focus.  He reports that he was diagnosed with attention deficit hyperactivity disorder in the past (as a child).  He reports that he has a past history of Ritalin use but did not stay on the medication due to feeling like a zombie.  Patient endorses difficulty focusing and forgetfulness.  He reports that he often forgets instructions presented to him and that he is easily distracted and disorganized.  He denies excessive movements or difficulty staying still.  He endorses acting without thinking on occasion and difficulty with executive function.  He denies difficulty controlling impulses, being easily frustrated, or being highly sensitive to criticism.  Patient reports that he has a history of using meth and states that he last used meth the night before last.  Patient is unsure of the amount of meth that he used.  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  Patient denies overt depressive symptoms nor does he endorse anxiety.  Patient denies a history of panic attacks.  Patient denies a  past history of hospitalization due to mental health.  Patient denies a history of suicide attempt.  A PHQ-9 screen was performed with the patient scoring at 13.  A GAD-7 screen was also performed with the patient scoring of 0.  Patient is alert and oriented x 4, calm, cooperative, and fully engaged in conversation during the encounter.  Patient endorses normal mood.  Patient denies suicidal or homicidal ideations.  He further denies auditory or visual hallucinations and does not appear to be responding to internal/external stimuli.  Patient denies paranoia or delusional thoughts.  Patient endorses good sleep but states that he is always tired.  Patient endorses good appetite and eats on average 3 meals per day.  Patient denies alcohol consumption stating that he has not drank in the last 10 years.  Patient endorses tobacco use and smokes on hours a pack per day.  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  Associated Signs/Symptoms: Depression Symptoms:  anhedonia, psychomotor agitation, fatigue, difficulty concentrating, loss of energy/fatigue, (Hypo) Manic Symptoms:  Distractibility, Financial Extravagance, Labiality of Mood, Anxiety Symptoms:  Agoraphobia, Social Anxiety, Psychotic Symptoms:  Patient denies PTSD Symptoms: Negative  Past Psychiatric History:  Patient endorses a past psychiatric history significant for ADHD. Patient reports that he was diagnosed as a child.  Patient denies a past history of hospitalization due to mental health.  Patient denies a past history of suicide attempt.  Patient denies a past history of homicide attempt.  Previous Psychotropic Medications: Yes , patient reports that he was placed on Ritalin as a  child. Patient reports that he uses Adderall illegally from the streets.  Substance Abuse History in the last 12 months:  Yes.    Consequences of Substance Abuse: Patient reports that he has used Methamphetamine  and Adderall. Patient states that he occasionally uses marijuana.  Medical Consequences:  Patient denies Legal Consequences:  Patient reports that he was charged with possession in 2012 Family Consequences:  Patient endorses family consequences Blackouts:  Patient endorses a past history of blacking out DT's: Patient denies Withdrawal Symptoms:   None  Past Medical History:  Past Medical History:  Diagnosis Date   GERD (gastroesophageal reflux disease)    History reviewed. No pertinent surgical history.  Family Psychiatric History:  Patient denies a family history of psychiatric illness  Family history of suicide attempt: Patient denies Family history of homicide attempt: Patient denies Family history of substance abuse: Patient denies  Family History: History reviewed. No pertinent family history.  Social History:   Social History   Socioeconomic History   Marital status: Single    Spouse name: Not on file   Number of children: Not on file   Years of education: Not on file   Highest education level: Not on file  Occupational History   Not on file  Tobacco Use   Smoking status: Every Day    Current packs/day: 1.50    Types: Cigarettes   Smokeless tobacco: Never  Substance and Sexual Activity   Alcohol use: Yes    Comment: Occ   Drug use: Yes    Frequency: 2.0 times per week    Types: Marijuana   Sexual activity: Not on file  Other Topics Concern   Not on file  Social History Narrative   Not on file   Social Drivers of Health   Financial Resource Strain: Not on file  Food Insecurity: Not on file  Transportation Needs: Not on file  Physical Activity: Not on file  Stress: Not on file  Social Connections: Not on file    Additional Social History:  Patient endorses social support. Patient denies having children of his own.  Patient endorses housing.  Patient is currently employed.  Patient denies a past history of military experience.  Patient has been to  prison after being sentenced by drug court.  Patient reports that he has been to jail due to obtaining property on false pretense.  Patient endorses taking some college courses.  Patient endorses access to weapons but states that they are in a secure location.  Allergies:  No Known Allergies  Metabolic Disorder Labs: No results found for: "HGBA1C", "MPG" No results found for: "PROLACTIN" No results found for: "CHOL", "TRIG", "HDL", "CHOLHDL", "VLDL", "LDLCALC" No results found for: "TSH"  Therapeutic Level Labs: No results found for: "LITHIUM" No results found for: "CBMZ" No results found for: "VALPROATE"  Current Medications: Current Outpatient Medications  Medication Sig Dispense Refill   atomoxetine  (STRATTERA ) 18 MG capsule Take 1 capsule (18 mg total) by mouth 2 (two) times daily with a meal. 60 capsule 1   acetaminophen  (TYLENOL ) 500 MG tablet Take 1,000 mg by mouth every 6 (six) hours as needed. Patient used this medication for his foot pain.     aspirin-acetaminophen -caffeine (EXCEDRIN MIGRAINE) 250-250-65 MG per tablet Take 2 tablets by mouth every 6 (six) hours as needed. Patient used this medication for his migraine headache.     doxycycline  (VIBRAMYCIN ) 100 MG capsule Take 1 capsule (100 mg total) by mouth 2 (two) times daily. 20 capsule 0  esomeprazole (NEXIUM) 10 MG packet Take 10 mg by mouth daily before breakfast.     lidocaine  (LIDODERM ) 5 % Place 1 patch onto the skin daily. Remove & Discard patch within 12 hours or as directed by MD 30 patch 0   ondansetron  (ZOFRAN ) 4 MG tablet Take 1 tablet (4 mg total) by mouth every 6 (six) hours. 12 tablet 0   oxyCODONE  (ROXICODONE ) 5 MG immediate release tablet Take 1 tablet (5 mg total) by mouth every 4 (four) hours as needed for severe pain. 15 tablet 0   No current facility-administered medications for this visit.    Musculoskeletal: Strength & Muscle Tone: within normal limits Gait & Station: normal Patient leans:  N/A  Psychiatric Specialty Exam: Review of Systems  Psychiatric/Behavioral:  Negative for decreased concentration, dysphoric mood, hallucinations, self-injury, sleep disturbance and suicidal ideas. The patient is not nervous/anxious and is not hyperactive.     Blood pressure (!) 133/95, pulse 82, temperature 97.8 F (36.6 C), temperature source Oral, height 6\' 3"  (1.905 m), weight 182 lb 9.6 oz (82.8 kg), SpO2 99%.Body mass index is 22.82 kg/m.  General Appearance: Casual  Eye Contact:  Good  Speech:  Clear and Coherent and Normal Rate  Volume:  Normal  Mood:  Euthymic  Affect:  Appropriate  Thought Process:  Coherent, Goal Directed, and Descriptions of Associations: Intact  Orientation:  Full (Time, Place, and Person)  Thought Content:  WDL  Suicidal Thoughts:  No  Homicidal Thoughts:  No  Memory:  Immediate;   Good Recent;   Good Remote;   Fair  Judgement:  Fair  Insight:  Fair  Psychomotor Activity:  Normal  Concentration:  Concentration: Good and Attention Span: Good  Recall:  Good  Fund of Knowledge:Good  Language: Good  Akathisia:  No  Handed:  Right  AIMS (if indicated):  not done  Assets:  Communication Skills Desire for Improvement Housing Social Support Vocational/Educational  ADL's:  Intact  Cognition: WNL  Sleep:  Good   Screenings: GAD-7    Flowsheet Row Office Visit from 11/08/2023 in [facility]  Total GAD-7 Score 0      PHQ2-9    Flowsheet Row Office Visit from 11/08/2023 in [facility]  PHQ-2 Total Score 2  PHQ-9 Total Score 13      Flowsheet Row Office Visit from 11/08/2023 in [facility] ED from [facility] in [facility] from 01/28/2023 in [facility] Emergency Department at [facility]  C-SSRS RISK CATEGORY No Risk No Risk No Risk       Assessment and Plan:   Taylor Owen is a 43 year old male with a  self-reported history of ADHD who presents to [facility] to establish psychiatric care and for medication management.  Patient presents to the encounter stating that he has been struggling with focus and concentration.  He reports that he has a past history of ADHD and was diagnosed as a kid.  Patient is not currently on any medications at this time but states that he has used Ritalin in the past.  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  Due to his use of methamphetamine and Adderall, provider informed patient that he would not be prescribed any controlled substances at this time.  Provider recommended the patient be placed on Strattera  18 mg 2 times daily (with meals) for the management of his  attention and concentration.  Patient was agreeable to recommendation.  Though patient denied overt depressive symptoms, a PHQ-9 screen was performed with the patient scoring a 13.  A GAD-7 screen was also performed with the patient scoring a 0.  Patient denies medication management at this time.  Patient denies suicidal ideations and is able to contract for safety following the conclusion of the encounter.  Collaboration of Care: Medication Management AEB provider managing patient's psychiatric medications and Psychiatrist AEB patient being followed by mental health provider at this facility  Patient/Guardian was advised Release of Information must be obtained prior to any record release in order to collaborate their care with an outside provider. Patient/Guardian was advised if they have not already done so to contact the registration department to sign all necessary forms in order for us  to release information regarding their care.   Consent: Patient/Guardian gives verbal consent for treatment and assignment of benefits for services provided during this visit. Patient/Guardian expressed understanding and agreed to  proceed.   1. History of ADHD (Primary)  - atomoxetine  (STRATTERA ) 18 MG capsule; Take 1 capsule (18 mg total) by mouth 2 (two) times daily with a meal.  Dispense: 60 capsule; Refill: 1  Patient to follow up in 6 weeks Provider spent a total of 35 minutes with the patient/reviewing patient's chart  Taylor Kasal, PA 5/16/202512:43 PM

## 2023-12-19 ENCOUNTER — Encounter (HOSPITAL_COMMUNITY): Admitting: Physician Assistant
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
